# Patient Record
Sex: Male | Born: 1970 | Race: Black or African American | Hispanic: No | Marital: Single | Smoking: Current some day smoker
Health system: Southern US, Community
[De-identification: ages and names within clinical notes are randomized; demographics above are authoritative.]

## PROBLEM LIST (undated history)

## (undated) DIAGNOSIS — R55 Syncope and collapse: Secondary | ICD-10-CM

## (undated) DIAGNOSIS — R569 Unspecified convulsions: Secondary | ICD-10-CM

---

## 2015-05-27 ENCOUNTER — Other Ambulatory Visit: Payer: Self-pay

## 2015-05-27 ENCOUNTER — Emergency Department (HOSPITAL_COMMUNITY): Payer: Self-pay

## 2015-05-27 ENCOUNTER — Emergency Department (HOSPITAL_COMMUNITY)
Admission: EM | Admit: 2015-05-27 | Discharge: 2015-05-27 | Disposition: A | Discharge status: Home / Self Care | Payer: Self-pay | Attending: Emergency Medicine | Admitting: Emergency Medicine

## 2015-05-27 ENCOUNTER — Encounter (HOSPITAL_COMMUNITY): Payer: Self-pay | Admitting: Emergency Medicine

## 2015-05-27 DIAGNOSIS — Y990 Civilian activity done for income or pay: Secondary | ICD-10-CM | POA: Insufficient documentation

## 2015-05-27 DIAGNOSIS — S299XXA Unspecified injury of thorax, initial encounter: Secondary | ICD-10-CM | POA: Insufficient documentation

## 2015-05-27 DIAGNOSIS — R55 Syncope and collapse: Secondary | ICD-10-CM | POA: Insufficient documentation

## 2015-05-27 DIAGNOSIS — W19XXXA Unspecified fall, initial encounter: Secondary | ICD-10-CM

## 2015-05-27 DIAGNOSIS — Y9389 Activity, other specified: Secondary | ICD-10-CM | POA: Insufficient documentation

## 2015-05-27 DIAGNOSIS — Z720 Tobacco use: Secondary | ICD-10-CM | POA: Insufficient documentation

## 2015-05-27 DIAGNOSIS — Y9289 Other specified places as the place of occurrence of the external cause: Secondary | ICD-10-CM | POA: Insufficient documentation

## 2015-05-27 DIAGNOSIS — Z79899 Other long term (current) drug therapy: Secondary | ICD-10-CM | POA: Insufficient documentation

## 2015-05-27 DIAGNOSIS — S0990XA Unspecified injury of head, initial encounter: Secondary | ICD-10-CM | POA: Insufficient documentation

## 2015-05-27 DIAGNOSIS — W1839XA Other fall on same level, initial encounter: Secondary | ICD-10-CM | POA: Insufficient documentation

## 2015-05-27 DIAGNOSIS — R0789 Other chest pain: Secondary | ICD-10-CM

## 2015-05-27 DIAGNOSIS — S93401A Sprain of unspecified ligament of right ankle, initial encounter: Secondary | ICD-10-CM | POA: Insufficient documentation

## 2015-05-27 LAB — CBC
HCT: 38.9 % — ABNORMAL LOW (ref 39.0–52.0)
HEMOGLOBIN: 13.7 g/dL (ref 13.0–17.0)
MCH: 31.3 pg (ref 26.0–34.0)
MCHC: 35.2 g/dL (ref 30.0–36.0)
MCV: 88.8 fL (ref 78.0–100.0)
PLATELETS: 237 10*3/uL (ref 150–400)
RBC: 4.38 MIL/uL (ref 4.22–5.81)
RDW: 13.6 % (ref 11.5–15.5)
WBC: 7.5 10*3/uL (ref 4.0–10.5)

## 2015-05-27 LAB — BASIC METABOLIC PANEL
Anion gap: 8 (ref 5–15)
BUN: 25 mg/dL — AB (ref 6–20)
CALCIUM: 8.7 mg/dL — AB (ref 8.9–10.3)
CO2: 23 mmol/L (ref 22–32)
Chloride: 107 mmol/L (ref 101–111)
Creatinine, Ser: 1.1 mg/dL (ref 0.61–1.24)
GFR calc Af Amer: >60 mL/min (ref >60)
GLUCOSE: 124 mg/dL — AB (ref 65–99)
Potassium: 3.3 mmol/L — ABNORMAL LOW (ref 3.5–5.1)
Sodium: 138 mmol/L (ref 135–145)

## 2015-05-27 LAB — I-STAT TROPONIN, ED: TROPONIN I, POC: 0 ng/mL (ref 0.00–0.08)

## 2015-05-27 MED ORDER — IBUPROFEN 600 MG PO TABS: 600.0000 mg | ORAL_TABLET | Freq: Four times a day (QID) | ORAL | Status: DC | PRN

## 2015-05-27 MED ORDER — HYDROCODONE-ACETAMINOPHEN 5-325 MG PO TABS: 1.0000 | ORAL_TABLET | Freq: Four times a day (QID) | ORAL | Status: DC | PRN

## 2015-05-27 NOTE — ED Notes (Signed)
Patient visualized eating in the lobby, patient was previously told while in triage nothing to eat or drink til seen by provider.

## 2015-05-27 NOTE — ED Notes (Signed)
Pt c/o headache, chest pain and rt ankle pain. States he fell at work on Wednesday and hit his head. He admits that he lost consciousness and on lookers stated he looked to be having a seizure. He states he went home after incident

## 2015-05-27 NOTE — ED Provider Notes (Signed)
CSN: [id number]     Arrival date & time 05/27/15  0256 History   First MD Initiated Contact with Patient 05/27/15 [phone]     Chief Complaint  Patient presents with  . Chest Pain  . Ankle Pain    right     (Consider location/radiation/quality/duration/timing/severity/associated sxs/prior Treatment) HPI  Patient presents with multiple complaints. Patient reports that he fell at work on Wednesday. He hurt his right ankle. He is noted swelling and pain. Pain is worse with walking. Currently it is 8 out of 10. Has not done anything for the pain. He also reports chest pain. It is stabbing in nature. This started last night. Denies shortness of breath.  Denies cough or fever. Reports that after the fall he had a questionable episode of loss of consciousness. He was not evaluated. He has been ambulatory. Denies any history of hypertension, hyperlipidemia, diabetes. He does report an early family history of heart disease. He is a current smoker.  History reviewed. No pertinent past medical history. History reviewed. No pertinent past surgical history. History reviewed. No pertinent family history. Social History  Substance Use Topics  . Smoking status: Current Every Day Smoker -- 0.50 packs/day    Types: Cigarettes  . Smokeless tobacco: None  . Alcohol Use: No    Review of Systems  Constitutional: Negative.  Negative for fever.  Respiratory: Positive for chest tightness. Negative for shortness of breath.   Cardiovascular: Negative.  Negative for chest pain and leg swelling.  Gastrointestinal: Negative.  Negative for abdominal pain.  Genitourinary: Negative.  Negative for dysuria.  Musculoskeletal: Negative for back pain.       Ankle pain  Skin: Negative for rash.  Neurological: Positive for syncope and headaches. Negative for weakness, light-headedness and numbness.  All other systems reviewed and are negative.     Allergies  Review of patient's allergies indicates no known  allergies.  Home Medications   Prior to Admission medications   Medication Sig Start Date End Date Taking? Authorizing Provider  buPROPion (WELLBUTRIN SR) 200 MG 12 hr tablet Take 200 mg by mouth 2 (two) times daily.   Yes Historical Provider, MD  risperiDONE (RISPERDAL) 1 MG tablet Take 1 mg by mouth 2 (two) times daily.   Yes Historical Provider, MD  traMADol (ULTRAM) 50 MG tablet Take 50 mg by mouth every 6 (six) hours as needed (back pain).   Yes Historical Provider, MD  HYDROcodone-acetaminophen (NORCO/VICODIN) 5-325 MG tablet Take 1 tablet by mouth every 6 (six) hours as needed. 05/27/15   Shon Baton, MD  ibuprofen (ADVIL,MOTRIN) 600 MG tablet Take 1 tablet (600 mg total) by mouth every 6 (six) hours as needed. 05/27/15   Shon Baton, MD   BP 133/92 mmHg  Pulse 88  Temp(Src) 98.7 F (37.1 C) (Oral)  Resp 20  SpO2 100% Physical Exam  Constitutional: He is oriented to person, place, and time. He appears well-developed and well-nourished. No distress.  HENT:  Head: Normocephalic and atraumatic.  Right Ear: External ear normal.  Left Ear: External ear normal.  Mouth/Throat: Oropharynx is clear and moist.  Eyes: EOM are normal. Pupils are equal, round, and reactive to light.  Disconjugate gaze; abduction of left eye  Neck: Normal range of motion. Neck supple.  Cardiovascular: Normal rate, regular rhythm and normal heart sounds.   No murmur heard. Pulmonary/Chest: Effort normal and breath sounds normal. No respiratory distress. He has no wheezes. He exhibits tenderness.  Abdominal: Soft. Bowel sounds are normal.  There is no tenderness. There is no rebound.  Musculoskeletal: He exhibits no edema.  Normal range of motion of the right ankle, mild swelling noted, 2+ DP pulse, no obvious deformities, tenderness to palpation over the lateral malleolus  Lymphadenopathy:    He has no cervical adenopathy.  Neurological: He is alert and oriented to person, place, and time.  5  out of 5 strength in all 4 extremities, no dysmetria to finger-nose-finger  Skin: Skin is warm and dry.  Psychiatric: He has a normal mood and affect.  Nursing note and vitals reviewed.   ED Course  Procedures (including critical care time) Labs Review Labs Reviewed  BASIC METABOLIC PANEL  CBC    Imaging Review No results found. I have personally reviewed and evaluated these images and lab results as part of my medical decision-making.   EKG Interpretation ED ECG REPORT   Date: 05/27/2015  Rate: 88  Rhythm: normal sinus rhythm  QRS Axis: normal  Intervals: normal  ST/T Wave abnormalities: normal  Conduction Disutrbances:none  Narrative Interpretation:   Old EKG Reviewed: none available  I have personally reviewed the EKG tracing and agree with the computerized printout as noted.       MDM   Final diagnoses:  Fall, initial encounter  Ankle sprain, right, initial encounter  Chest wall pain    Patient presents with multiple complaints. Nontoxic on exam. Vital signs are reassuring. Patient reports recent fall with associated right ankle pain. Also has developed chest pain and reports possible syncopal episode. Nonfocal on exam. Pain is reproducible in the chest and EKG is reassuring. Chest x-ray is within normal limits. He is neurovascularly intact in the right ankle. X-ray of the right ankle negative. CT head obtained given report of syncope, headache, and fall. CT scan is negative. Basic labwork is also reassuring including troponin. Suspect musculoskeletal pain secondary to fall. Also ankle sprain. Will discharge home with a short course of pain medication.  After history, exam, and medical workup I feel the patient has been appropriately medically screened and is safe for discharge home. Pertinent diagnoses were discussed with the patient. Patient was given return precautions.     Shon Batonourtney F Horton, MD 05/27/15 [phone]

## 2015-05-27 NOTE — Discharge Instructions (Signed)
Ankle Sprain °An ankle sprain is an injury to the strong, fibrous tissues (ligaments) that hold the bones of your ankle joint together.  °CAUSES °An ankle sprain is usually caused by a fall or by twisting your ankle. Ankle sprains most commonly occur when you step on the outer edge of your foot, and your ankle turns inward. People who participate in sports are more prone to these types of injuries.  °SYMPTOMS  °· Pain in your ankle. The pain may be present at rest or only when you are trying to stand or walk. °· Swelling. °· Bruising. Bruising may develop immediately or within 1 to 2 days after your injury. °· Difficulty standing or walking, particularly when turning corners or changing directions. °DIAGNOSIS  °Your caregiver will ask you details about your injury and perform a physical exam of your ankle to determine if you have an ankle sprain. During the physical exam, your caregiver will press on and apply pressure to specific areas of your foot and ankle. Your caregiver will try to move your ankle in certain ways. An X-ray exam may be done to be sure a bone was not broken or a ligament did not separate from one of the bones in your ankle (avulsion fracture).  °TREATMENT  °Certain types of braces can help stabilize your ankle. Your caregiver can make a recommendation for this. Your caregiver may recommend the use of medicine for pain. If your sprain is severe, your caregiver may refer you to a surgeon who helps to restore function to parts of your skeletal system (orthopedist) or a physical therapist. °HOME CARE INSTRUCTIONS  °· Apply ice to your injury for 1-2 days or as directed by your caregiver. Applying ice helps to reduce inflammation and pain. °· Put ice in a plastic bag. °· Place a towel between your skin and the bag. °· Leave the ice on for 15-20 minutes at a time, every 2 hours while you are awake. °· Only take over-the-counter or prescription medicines for pain, discomfort, or fever as directed by  your caregiver. °· Elevate your injured ankle above the level of your heart as much as possible for 2-3 days. °· If your caregiver recommends crutches, use them as instructed. Gradually put weight on the affected ankle. Continue to use crutches or a cane until you can walk without feeling pain in your ankle. °· If you have a plaster splint, wear the splint as directed by your caregiver. Do not rest it on anything harder than a pillow for the first 24 hours. Do not put weight on it. Do not get it wet. You may take it off to take a shower or bath. °· You may have been given an elastic bandage to wear around your ankle to provide support. If the elastic bandage is too tight (you have numbness or tingling in your foot or your foot becomes cold and blue), adjust the bandage to make it comfortable. °· If you have an air splint, you may blow more air into it or let air out to make it more comfortable. You may take your splint off at night and before taking a shower or bath. Wiggle your toes in the splint several times per day to decrease swelling. °SEEK MEDICAL CARE IF:  °· You have rapidly increasing bruising or swelling. °· Your toes feel extremely cold or you lose feeling in your foot. °· Your pain is not relieved with medicine. °SEEK IMMEDIATE MEDICAL CARE IF: °· Your toes are numb or blue. °·   You have severe pain that is increasing. MAKE SURE YOU:   Understand these instructions.  Will watch your condition.  Will get help right away if you are not doing well or get worse.   This information is not intended to replace advice given to you by your health care provider. Make sure you discuss any questions you have with your health care provider.   Document Released: 07/20/2005 Document Revised: 08/10/2014 Document Reviewed: 08/01/2011 Elsevier Interactive Patient Education 2016 Elsevier Inc. Chest Wall Pain Chest wall pain is pain in or around the bones and muscles of your chest. Sometimes, an injury causes  this pain. Sometimes, the cause may not be known. This pain may take several weeks or longer to get better. HOME CARE INSTRUCTIONS  Pay attention to any changes in your symptoms. Take these actions to help with your pain:   Rest as told by your health care provider.   Avoid activities that cause pain. These include any activities that use your chest muscles or your abdominal and side muscles to lift heavy items.   If directed, apply ice to the painful area:  Put ice in a plastic bag.  Place a towel between your skin and the bag.  Leave the ice on for 20 minutes, 2-3 times per day.  Take over-the-counter and prescription medicines only as told by your health care provider.  Do not use tobacco products, including cigarettes, chewing tobacco, and e-cigarettes. If you need help quitting, ask your health care provider.  Keep all follow-up visits as told by your health care provider. This is important. SEEK MEDICAL CARE IF:  You have a fever.  Your chest pain becomes worse.  You have new symptoms. SEEK IMMEDIATE MEDICAL CARE IF:  You have nausea or vomiting.  You feel sweaty or light-headed.  You have a cough with phlegm (sputum) or you cough up blood.  You develop shortness of breath.   This information is not intended to replace advice given to you by your health care provider. Make sure you discuss any questions you have with your health care provider.   Document Released: 07/20/2005 Document Revised: 04/10/2015 Document Reviewed: 10/15/2014 Elsevier Interactive Patient Education [employer] Else[employer] Inc[employer]

## 2015-05-27 NOTE — ED Notes (Signed)
Patient c/o central chest pain that started last night. Patient also c/o right ankle pain x2-3 days ago.

## 2015-06-02 ENCOUNTER — Encounter (HOSPITAL_COMMUNITY): Payer: Self-pay | Admitting: Emergency Medicine

## 2015-06-02 ENCOUNTER — Emergency Department (HOSPITAL_COMMUNITY)
Admission: EM | Admit: 2015-06-02 | Discharge: 2015-06-02 | Discharge status: Against Medical Advice | Payer: Self-pay | Attending: Emergency Medicine | Admitting: Emergency Medicine

## 2015-06-02 DIAGNOSIS — Y9389 Activity, other specified: Secondary | ICD-10-CM | POA: Insufficient documentation

## 2015-06-02 DIAGNOSIS — X58XXXA Exposure to other specified factors, initial encounter: Secondary | ICD-10-CM | POA: Insufficient documentation

## 2015-06-02 DIAGNOSIS — S0990XA Unspecified injury of head, initial encounter: Secondary | ICD-10-CM | POA: Insufficient documentation

## 2015-06-02 DIAGNOSIS — Y990 Civilian activity done for income or pay: Secondary | ICD-10-CM | POA: Insufficient documentation

## 2015-06-02 DIAGNOSIS — Y9289 Other specified places as the place of occurrence of the external cause: Secondary | ICD-10-CM | POA: Insufficient documentation

## 2015-06-02 DIAGNOSIS — S99919A Unspecified injury of unspecified ankle, initial encounter: Secondary | ICD-10-CM | POA: Insufficient documentation

## 2015-06-02 DIAGNOSIS — F17210 Nicotine dependence, cigarettes, uncomplicated: Secondary | ICD-10-CM | POA: Insufficient documentation

## 2015-06-02 NOTE — ED Notes (Signed)
Pt presents to ED to be seen for head inj and ankle inj he stated happened @ 0215 while working for [employer]. Pt found to be locked in bathroom in the basement by security then followed to 4th floor and did the same. Pt holding hard had with [employer] insignia. Pt became very agitated at being questioned regarding employment/injuries/timeline. Pt here on 24th for very similar complaint, stating he worked for [employer]. Pt then walking with steady gait came from triage room stating he was leaving, pt then became involved in aggressive verbal conversation with security and [employer]. Pt then left the property.

## 2015-06-25 ENCOUNTER — Encounter (HOSPITAL_COMMUNITY): Payer: Self-pay | Admitting: Emergency Medicine

## 2015-06-25 DIAGNOSIS — R000 Tachycardia, unspecified: Secondary | ICD-10-CM | POA: Insufficient documentation

## 2015-06-25 DIAGNOSIS — Z79899 Other long term (current) drug therapy: Secondary | ICD-10-CM | POA: Insufficient documentation

## 2015-06-25 DIAGNOSIS — R42 Dizziness and giddiness: Secondary | ICD-10-CM | POA: Insufficient documentation

## 2015-06-25 DIAGNOSIS — R112 Nausea with vomiting, unspecified: Secondary | ICD-10-CM | POA: Insufficient documentation

## 2015-06-25 DIAGNOSIS — M791 Myalgia: Secondary | ICD-10-CM | POA: Insufficient documentation

## 2015-06-25 DIAGNOSIS — F17210 Nicotine dependence, cigarettes, uncomplicated: Secondary | ICD-10-CM | POA: Insufficient documentation

## 2015-06-25 DIAGNOSIS — R61 Generalized hyperhidrosis: Secondary | ICD-10-CM | POA: Insufficient documentation

## 2015-06-25 DIAGNOSIS — R55 Syncope and collapse: Secondary | ICD-10-CM | POA: Insufficient documentation

## 2015-06-25 NOTE — ED Notes (Signed)
The patient said they brought him from work because his co-workers said he passed out for about 30 seconds.  The patient said that his co-workers told him when he came to he was talking crazy.  The patient is complaining of neck pain rates it 7/10.

## 2015-06-26 ENCOUNTER — Emergency Department (HOSPITAL_COMMUNITY)
Admission: EM | Admit: 2015-06-26 | Discharge: 2015-06-26 | Disposition: A | Discharge status: Home / Self Care | Payer: Self-pay | Attending: Emergency Medicine | Admitting: Emergency Medicine

## 2015-06-26 DIAGNOSIS — R55 Syncope and collapse: Secondary | ICD-10-CM

## 2015-06-26 LAB — BASIC METABOLIC PANEL
Anion gap: 6 (ref 5–15)
BUN: 19 mg/dL (ref 6–20)
CHLORIDE: 105 mmol/L (ref 101–111)
CO2: 27 mmol/L (ref 22–32)
CREATININE: 1.29 mg/dL — AB (ref 0.61–1.24)
Calcium: 9.1 mg/dL (ref 8.9–10.3)
Glucose, Bld: 102 mg/dL — ABNORMAL HIGH (ref 65–99)
Potassium: 4.1 mmol/L (ref 3.5–5.1)
SODIUM: 138 mmol/L (ref 135–145)

## 2015-06-26 LAB — CBC
HEMATOCRIT: 40.8 % (ref 39.0–52.0)
Hemoglobin: 14.2 g/dL (ref 13.0–17.0)
MCH: 31.5 pg (ref 26.0–34.0)
MCHC: 34.8 g/dL (ref 30.0–36.0)
MCV: 90.5 fL (ref 78.0–100.0)
PLATELETS: 234 10*3/uL (ref 150–400)
RBC: 4.51 MIL/uL (ref 4.22–5.81)
RDW: 13.6 % (ref 11.5–15.5)
WBC: 8.5 10*3/uL (ref 4.0–10.5)

## 2015-06-26 LAB — URINALYSIS, ROUTINE W REFLEX MICROSCOPIC
Bilirubin Urine: NEGATIVE
GLUCOSE, UA: NEGATIVE mg/dL
Hgb urine dipstick: NEGATIVE
Ketones, ur: NEGATIVE mg/dL
LEUKOCYTES UA: NEGATIVE
NITRITE: NEGATIVE
Protein, ur: NEGATIVE mg/dL
Specific Gravity, Urine: 1.023 (ref 1.005–1.030)
pH: 6 (ref 5.0–8.0)

## 2015-06-26 MED ORDER — ETODOLAC 400 MG PO TABS: 400.0000 mg | ORAL_TABLET | Freq: Two times a day (BID) | ORAL | Status: DC | PRN

## 2015-06-26 NOTE — ED Notes (Signed)
Pt verbalized understanding of d/c instructions, prescriptions, and follow-up care. No further questions/concerns, VSS, ambulatory w/ steady gait (refused wheelchair) 

## 2015-06-26 NOTE — ED Provider Notes (Signed)
CSN: [id number]     Arrival date & time 06/25/15  2346 History  By signing my name below, I, Emmanuella Mensah, attest that this documentation has been prepared under the direction and in the presence of Gilda Crease, MD. Electronically Signed: Angelene Giovanni, ED Scribe. 06/26/2015. 1:28 AM.     Chief Complaint  Patient presents with  . Loss of Consciousness    The patient said they brought him from work because his co-workers said he passed out for about 30 seconds.  The patient said that his co-workers told him when he came to he was talking crazy.      The history is provided by the patient. No language interpreter was used.   HPI Comments: Joseph Anthony is a 44 y.o. male who presents to the Emergency Department complaining of gradually worsening intermittent episodes of dizziness and loss of consciousness onset 2 weeks ago. He reports associated mild tachycardia and vomiting in the waiting room today. He denies any abdominal pain or numbness/weakness. He states that his latest episode was approx 2 hours ago. He explains his episodes as having a generalized sharp pain throughout his body which leads to diaphoresis and dizziness. He states that he has had two episodes at work and he is in jeopardy of loosing his job. He adds that his co-workers have informed him that sometimes he walks off balance while at work.    History reviewed. No pertinent past medical history. History reviewed. No pertinent past surgical history. History reviewed. No pertinent family history. Social History  Substance Use Topics  . Smoking status: Current Every Day Smoker -- 0.50 packs/day    Types: Cigarettes  . Smokeless tobacco: None  . Alcohol Use: No    Review of Systems  Constitutional: Negative for fever.  Gastrointestinal: Positive for nausea and vomiting. Negative for abdominal pain and diarrhea.  Musculoskeletal: Negative for arthralgias.  Neurological: Positive for dizziness. Negative  for weakness and numbness.  All other systems reviewed and are negative.     Allergies  Review of patient's allergies indicates no known allergies.  Home Medications   Prior to Admission medications   Medication Sig Start Date End Date Taking? Authorizing Provider  buPROPion (WELLBUTRIN SR) 200 MG 12 hr tablet Take 200 mg by mouth 2 (two) times daily.    Historical Provider, MD  HYDROcodone-acetaminophen (NORCO/VICODIN) 5-325 MG tablet Take 1 tablet by mouth every 6 (six) hours as needed. 05/27/15   Shon Baton, MD  ibuprofen (ADVIL,MOTRIN) 600 MG tablet Take 1 tablet (600 mg total) by mouth every 6 (six) hours as needed. 05/27/15   Shon Baton, MD  risperiDONE (RISPERDAL) 1 MG tablet Take 1 mg by mouth 2 (two) times daily.    Historical Provider, MD  traMADol (ULTRAM) 50 MG tablet Take 50 mg by mouth every 6 (six) hours as needed (back pain).    Historical Provider, MD   Pulse 89  Temp(Src) 98.4 F (36.9 C) (Oral)  Resp 16  Ht  (1.702 m)  Wt 249 lb 11.2 oz (113.263 kg)  BMI 39.10 kg/m2  SpO2 97% Physical Exam  Constitutional: He is oriented to person, place, and time. He appears well-developed and well-nourished. No distress.  HENT:  Head: Normocephalic and atraumatic.  Right Ear: Hearing normal.  Left Ear: Hearing normal.  Nose: Nose normal.  Mouth/Throat: Oropharynx is clear and moist and mucous membranes are normal.  Eyes: Conjunctivae and EOM are normal. Pupils are equal, round, and reactive to light.  Neck: Normal range of motion. Neck supple. Muscular tenderness present. No spinous process tenderness present.  Cardiovascular: Regular rhythm, S1 normal and S2 normal.  Exam reveals no gallop and no friction rub.   No murmur heard. Pulmonary/Chest: Effort normal and breath sounds normal. No respiratory distress. He exhibits no tenderness.  Abdominal: Soft. Normal appearance and bowel sounds are normal. There is no hepatosplenomegaly. There is no  tenderness. There is no rebound, no guarding, no tenderness at McBurney's point and negative Murphy's sign. No hernia.  Musculoskeletal: Normal range of motion.  Neurological: He is alert and oriented to person, place, and time. He has normal strength. No cranial nerve deficit or sensory deficit. Coordination normal. GCS eye subscore is 4. GCS verbal subscore is 5. GCS motor subscore is 6.  Extraocular muscle movement: normal No visual field cut Pupils: equal and reactive both direct and consensual response is normal No nystagmus present    Sensory function is intact to light touch, pinprick Proprioception intact  Grip strength 5/5 symmetric in upper extremities No pronator drift Normal finger to nose bilaterally  Lower extremity strength 5/5 against gravity Normal heel to shin bilaterally  Gait: normal   Skin: Skin is warm, dry and intact. No rash noted. No cyanosis.  Psychiatric: He has a normal mood and affect. His speech is normal and behavior is normal. Thought content normal.  Nursing note and vitals reviewed.   ED Course  Procedures (including critical care time) DIAGNOSTIC STUDIES: Oxygen Saturation is 97% on RA, adequate by my interpretation.    COORDINATION OF CARE: 1:25 AM- Pt advised of plan for treatment and pt agrees. Will refer to Neurologist after reviewing labs and past visits.    Labs Review Labs Reviewed  BASIC METABOLIC PANEL - Abnormal; Notable for the following:    Glucose, Bld 102 (*)    Creatinine, Ser 1.29 (*)    All other components within normal limits  CBC  URINALYSIS, ROUTINE W REFLEX MICROSCOPIC (NOT AT [facility])  CBG MONITORING, ED    Imaging Review No results found.   Gilda Crease, MD has personally reviewed and evaluated these images and lab results as part of his medical decision-making.   EKG Interpretation   Date/Time:  Wednesday June 26 2015 00:02:19 EST Ventricular Rate:  89 PR Interval:  158 QRS Duration: 86 QT  Interval:  344 QTC Calculation: 418 R Axis:   26 Text Interpretation:  Normal sinus rhythm Cannot rule out Anterior infarct  , age undetermined Abnormal ECG No significant change since last tracing  Confirmed by POLLINA  MD, CHRISTOPHER [phone]) on 06/26/2015 1:22:51 AM      MDM   Final diagnoses:  None   syncope  Presents to the emergency Department with multiple complaints. Patient reports that he passed out at work. Patient has had several episodes of passing out recently. He reportedly had a fall at work caused by passing out recently, was seen at the [employer]. Reviewing this encounter reveals that he did have a head CT and cardiac evaluation at that time, both of which were negative. Patient reports that he has episodes where he suddenly becomes weak, then dizzy, then he passes out. He bystanders at work today reportedly stated that after he passed out he awakened, was confused and "talking crazy" for a period of time. By arrival to the ER, patient back to normal baseline has no neurologic deficit. He does not require repeat CT of his head. He is complaining of neck pain, examination  reveals bilateral paraspinal soft tissue tenderness without midline tenderness. Cervical spine clinically cleared.  Records have been reviewed and the patient has been seen multiple times in the ER with similar complaints. He has also exhibited very erratic behavior during these visits. Patient is exhibiting somewhat odd affect here in the ER, but is not homicidal or suicidal. He is appropriate for discharge at this time. Will refer to neurology for further evaluation of syncope. As he did have a period of confusion after his syncope, seizure is considered, although felt to be unlikely.  I personally performed the services described in this documentation, which was scribed in my presence. The recorded information has been reviewed and is accurate.   Gilda Creasehristopher J Pollina, MD 06/26/15 [phone]

## 2015-06-26 NOTE — Discharge Instructions (Signed)

## 2015-06-28 ENCOUNTER — Observation Stay (HOSPITAL_BASED_OUTPATIENT_CLINIC_OR_DEPARTMENT_OTHER): Payer: Self-pay

## 2015-06-28 ENCOUNTER — Observation Stay (HOSPITAL_COMMUNITY)
Admission: EM | Admit: 2015-06-28 | Discharge: 2015-06-30 | Disposition: A | Discharge status: Home / Self Care | Payer: Self-pay | Attending: Internal Medicine | Admitting: Internal Medicine

## 2015-06-28 ENCOUNTER — Emergency Department (HOSPITAL_COMMUNITY): Payer: Self-pay

## 2015-06-28 ENCOUNTER — Observation Stay (HOSPITAL_COMMUNITY): Payer: Self-pay

## 2015-06-28 ENCOUNTER — Encounter (HOSPITAL_COMMUNITY): Payer: Self-pay | Admitting: Emergency Medicine

## 2015-06-28 DIAGNOSIS — R55 Syncope and collapse: Principal | ICD-10-CM | POA: Diagnosis present

## 2015-06-28 DIAGNOSIS — Z8249 Family history of ischemic heart disease and other diseases of the circulatory system: Secondary | ICD-10-CM

## 2015-06-28 DIAGNOSIS — R079 Chest pain, unspecified: Secondary | ICD-10-CM

## 2015-06-28 DIAGNOSIS — R42 Dizziness and giddiness: Secondary | ICD-10-CM | POA: Insufficient documentation

## 2015-06-28 DIAGNOSIS — F17210 Nicotine dependence, cigarettes, uncomplicated: Secondary | ICD-10-CM

## 2015-06-28 DIAGNOSIS — R5383 Other fatigue: Secondary | ICD-10-CM | POA: Insufficient documentation

## 2015-06-28 DIAGNOSIS — Z8669 Personal history of other diseases of the nervous system and sense organs: Secondary | ICD-10-CM

## 2015-06-28 DIAGNOSIS — R1110 Vomiting, unspecified: Secondary | ICD-10-CM | POA: Insufficient documentation

## 2015-06-28 HISTORY — DX: Unspecified convulsions: R56.9

## 2015-06-28 LAB — CBC WITH DIFFERENTIAL/PLATELET
Basophils Absolute: 0 10*3/uL (ref 0.0–0.1)
Basophils Relative: 0 %
EOS ABS: 0.1 10*3/uL (ref 0.0–0.7)
EOS PCT: 2 %
HCT: 38.7 % — ABNORMAL LOW (ref 39.0–52.0)
Hemoglobin: 13.2 g/dL (ref 13.0–17.0)
LYMPHS PCT: 30 %
Lymphs Abs: 1.7 10*3/uL (ref 0.7–4.0)
MCH: 31.1 pg (ref 26.0–34.0)
MCHC: 34.1 g/dL (ref 30.0–36.0)
MCV: 91.3 fL (ref 78.0–100.0)
MONO ABS: 0.2 10*3/uL (ref 0.1–1.0)
Monocytes Relative: 4 %
NEUTROS ABS: 3.6 10*3/uL (ref 1.7–7.7)
Neutrophils Relative %: 64 %
PLATELETS: 217 10*3/uL (ref 150–400)
RBC: 4.24 MIL/uL (ref 4.22–5.81)
RDW: 13.8 % (ref 11.5–15.5)
WBC: 5.6 10*3/uL (ref 4.0–10.5)

## 2015-06-28 LAB — COMPREHENSIVE METABOLIC PANEL
ALK PHOS: 83 U/L (ref 38–126)
ALT: 16 U/L — ABNORMAL LOW (ref 17–63)
ANION GAP: 7 (ref 5–15)
AST: 28 U/L (ref 15–41)
Albumin: 3.4 g/dL — ABNORMAL LOW (ref 3.5–5.0)
BILIRUBIN TOTAL: 0.7 mg/dL (ref 0.3–1.2)
BUN: 17 mg/dL (ref 6–20)
CALCIUM: 8.6 mg/dL — AB (ref 8.9–10.3)
CO2: 24 mmol/L (ref 22–32)
Chloride: 107 mmol/L (ref 101–111)
Creatinine, Ser: 1.24 mg/dL (ref 0.61–1.24)
Glucose, Bld: 118 mg/dL — ABNORMAL HIGH (ref 65–99)
POTASSIUM: 3.4 mmol/L — AB (ref 3.5–5.1)
Sodium: 138 mmol/L (ref 135–145)
TOTAL PROTEIN: 6.4 g/dL — AB (ref 6.5–8.1)

## 2015-06-28 LAB — TROPONIN I: Troponin I: <0.03 ng/mL (ref <0.031)

## 2015-06-28 LAB — TSH: TSH: 0.772 u[IU]/mL (ref 0.350–4.500)

## 2015-06-28 LAB — CBC
HCT: 37.2 % — ABNORMAL LOW (ref 39.0–52.0)
Hemoglobin: 13 g/dL (ref 13.0–17.0)
MCH: 31.8 pg (ref 26.0–34.0)
MCHC: 34.9 g/dL (ref 30.0–36.0)
MCV: 91 fL (ref 78.0–100.0)
PLATELETS: 211 10*3/uL (ref 150–400)
RBC: 4.09 MIL/uL — AB (ref 4.22–5.81)
RDW: 13.8 % (ref 11.5–15.5)
WBC: 5.4 10*3/uL (ref 4.0–10.5)

## 2015-06-28 LAB — LIPID PANEL
Cholesterol: 145 mg/dL (ref 0–200)
HDL: 42 mg/dL (ref >40)
LDL Cholesterol: 92 mg/dL (ref 0–99)
TRIGLYCERIDES: 57 mg/dL (ref <150)
Total CHOL/HDL Ratio: 3.5 RATIO
VLDL: 11 mg/dL (ref 0–40)

## 2015-06-28 LAB — RAPID URINE DRUG SCREEN, HOSP PERFORMED
Amphetamines: NOT DETECTED
Barbiturates: NOT DETECTED
Benzodiazepines: NOT DETECTED
Cocaine: NOT DETECTED
OPIATES: NOT DETECTED
Tetrahydrocannabinol: NOT DETECTED

## 2015-06-28 LAB — I-STAT TROPONIN, ED: TROPONIN I, POC: 0 ng/mL (ref 0.00–0.08)

## 2015-06-28 LAB — ETHANOL: Alcohol, Ethyl (B): <5 mg/dL (ref <5)

## 2015-06-28 LAB — MAGNESIUM: MAGNESIUM: 1.7 mg/dL (ref 1.7–2.4)

## 2015-06-28 MED ORDER — RISPERIDONE 0.5 MG PO TABS: 1.0000 mg | ORAL_TABLET | Freq: Two times a day (BID) | ORAL | Status: DC

## 2015-06-28 MED ORDER — IOHEXOL 350 MG/ML SOLN
80.0000 mL | Freq: Once | INTRAVENOUS | Status: AC | PRN
  Administered 2015-06-28: 80 mL via INTRAVENOUS

## 2015-06-28 MED ORDER — ENOXAPARIN SODIUM 40 MG/0.4ML ~~LOC~~ SOLN
40.0000 mg | SUBCUTANEOUS | Status: DC
  Administered 2015-06-28 – 2015-06-29 (×2): 40 mg via SUBCUTANEOUS
  Filled 2015-06-28 (×2): qty 0.4

## 2015-06-28 MED ORDER — PROMETHAZINE HCL 25 MG PO TABS
12.5000 mg | ORAL_TABLET | Freq: Four times a day (QID) | ORAL | Status: DC | PRN
  Filled 2015-06-28 (×2): qty 1

## 2015-06-28 MED ORDER — SODIUM CHLORIDE 0.9 % IV BOLUS (SEPSIS)
1000.0000 mL | Freq: Once | INTRAVENOUS | Status: AC
  Administered 2015-06-28: 1000 mL via INTRAVENOUS

## 2015-06-28 MED ORDER — POTASSIUM CHLORIDE CRYS ER 20 MEQ PO TBCR
40.0000 meq | EXTENDED_RELEASE_TABLET | Freq: Once | ORAL | Status: AC
  Administered 2015-06-28: 40 meq via ORAL
  Filled 2015-06-28: qty 2

## 2015-06-28 MED ORDER — SODIUM CHLORIDE 0.9 % IJ SOLN
3.0000 mL | Freq: Two times a day (BID) | INTRAMUSCULAR | Status: DC
  Administered 2015-06-28 – 2015-06-29 (×2): 3 mL via INTRAVENOUS

## 2015-06-28 MED ORDER — DIPHENHYDRAMINE HCL 50 MG/ML IJ SOLN
25.0000 mg | Freq: Once | INTRAMUSCULAR | Status: AC
  Administered 2015-06-28: 25 mg via INTRAVENOUS
  Filled 2015-06-28: qty 1

## 2015-06-28 MED ORDER — ACETAMINOPHEN 500 MG PO TABS
500.0000 mg | ORAL_TABLET | Freq: Four times a day (QID) | ORAL | Status: DC | PRN
  Administered 2015-06-28: 500 mg via ORAL
  Filled 2015-06-28: qty 1

## 2015-06-28 MED ORDER — MAGNESIUM SULFATE 2 GM/50ML IV SOLN
2.0000 g | Freq: Once | INTRAVENOUS | Status: AC
  Administered 2015-06-28: 2 g via INTRAVENOUS
  Filled 2015-06-28: qty 50

## 2015-06-28 MED ORDER — PROCHLORPERAZINE EDISYLATE 5 MG/ML IJ SOLN
10.0000 mg | Freq: Four times a day (QID) | INTRAMUSCULAR | Status: DC | PRN
  Administered 2015-06-28: 10 mg via INTRAVENOUS
  Filled 2015-06-28: qty 2

## 2015-06-28 NOTE — ED Notes (Signed)
Pt states that he has had 2 other syncopal episodes and has an appointment with a neurologist on Monday.

## 2015-06-28 NOTE — ED Provider Notes (Signed)
CSN: [id number]     Arrival date & time 06/28/15  0645 History   First MD Initiated Contact with Patient 06/28/15 [id number]     Chief Complaint  Patient presents with  . Chest Pain  . Loss of Consciousness     (Consider location/radiation/quality/duration/timing/severity/associated sxs/prior Treatment) HPI Comments: Doing paperwork this AM, stood from sitting and passed out Felt lightheaded, full body weaknessn then had episode 45sec syncope No urinary/stool incontinence/no tongue biting no shaking Vomited on self Pt states whole body hurts, head, chest, abdomen CP middle of chest to lower abdomen, worse with movement, to lower back, worse with movement 7/10, sharp pain, nothing else makes it better or worse Not wearing hard hat Has had several episodes of syncope over last few weeks at work Ate cereal this AM Most recent was 11/22 and came to ED  No other medical problems other than depression +cigarettes No htn/hlp/DM/father died at 6032 of MI    Patient is a 44 y.o. male presenting with chest pain and syncope.  Chest Pain Associated symptoms: fatigue, syncope and vomiting   Associated symptoms: no abdominal pain, no back pain, no fever, no headache, no nausea, no numbness, no shortness of breath and no weakness   Loss of Consciousness Associated symptoms: chest pain and vomiting   Associated symptoms: no fever, no headaches, no nausea, no seizures, no shortness of breath and no weakness     History reviewed. No pertinent past medical history. History reviewed. No pertinent past surgical history. History reviewed. No pertinent family history. Social History  Substance Use Topics  . Smoking status: Current Some Day Smoker -- 0.50 packs/day    Types: Cigarettes  . Smokeless tobacco: None  . Alcohol Use: No    Review of Systems  Constitutional: Positive for fatigue. Negative for fever and appetite change.  HENT: Negative for sore throat.   Eyes: Negative for visual  disturbance.  Respiratory: Negative for shortness of breath.   Cardiovascular: Positive for chest pain and syncope.  Gastrointestinal: Positive for vomiting. Negative for nausea, abdominal pain, diarrhea, constipation and blood in stool.  Genitourinary: Negative for difficulty urinating.  Musculoskeletal: Negative for back pain and neck stiffness.  Skin: Negative for rash.  Neurological: Positive for syncope and light-headedness. Negative for seizures, facial asymmetry, speech difficulty, weakness, numbness and headaches.      Allergies  Review of patient's allergies indicates no known allergies.  Home Medications   Prior to Admission medications   Medication Sig Start Date End Date Taking? Authorizing Provider  buPROPion (WELLBUTRIN SR) 200 MG 12 hr tablet Take 200 mg by mouth 2 (two) times daily.   Yes Historical Provider, MD  risperiDONE (RISPERDAL) 1 MG tablet Take 1 mg by mouth 2 (two) times daily.   Yes Historical Provider, MD  traMADol (ULTRAM) 50 MG tablet Take 50 mg by mouth every 6 (six) hours as needed (back pain).   Yes Historical Provider, MD   BP 100/55 mmHg  Temp(Src) 98.3 F (36.8 C) (Oral)  Resp 15  Ht 5\' 7"  (1.702 m)  Wt 240 lb (108.863 kg)  BMI 37.58 kg/m2 Physical Exam  Constitutional: He is oriented to person, place, and time. He appears well-developed and well-nourished. No distress.  HENT:  Head: Normocephalic and atraumatic.  Eyes: Conjunctivae and EOM are normal.  Neck: Normal range of motion.  Cardiovascular: Normal rate, regular rhythm, normal heart sounds and intact distal pulses.  Exam reveals no gallop and no friction rub.   No murmur heard. Pulmonary/Chest:  Effort normal and breath sounds normal. No respiratory distress. He has no wheezes. He has no rales.  Abdominal: Soft. He exhibits no distension. There is no tenderness. There is no guarding.  Musculoskeletal: He exhibits no edema.  Neurological: He is alert and oriented to person, place, and  time. He has normal strength. No cranial nerve deficit or sensory deficit. Coordination and gait normal. GCS eye subscore is 4. GCS verbal subscore is 5. GCS motor subscore is 6.  Skin: Skin is warm and dry. He is not diaphoretic.  Nursing note and vitals reviewed.   ED Course  Procedures (including critical care time) Labs Review Labs Reviewed  COMPREHENSIVE METABOLIC PANEL - Abnormal; Notable for the following:    Potassium 3.4 (*)    Glucose, Bld 118 (*)    Calcium 8.6 (*)    Total Protein 6.4 (*)    Albumin 3.4 (*)    ALT 16 (*)    All other components within normal limits  CBC WITH DIFFERENTIAL/PLATELET - Abnormal; Notable for the following:    HCT 38.7 (*)    All other components within normal limits  URINE RAPID DRUG SCREEN, HOSP PERFORMED  ETHANOL  TROPONIN I  TROPONIN I  Rosezena Sensor, ED    Imaging Review Dg Chest 1 View  06/28/2015  CLINICAL DATA:  Syncope. EXAM: CHEST 1 VIEW COMPARISON:  05/27/2015. FINDINGS: Mediastinum and hilar structures normal. Borderline cardiomegaly. Borderline pulmonary venous congestion. Bilateral pulmonary interstitial prominence. A mild component congestive heart failure cannot be excluded. Pneumonitis cannot be excluded. Low lung volumes. No pleural effusion or pneumothorax. IMPRESSION: 1. Borderline cardiomegaly and pulmonary venous congestion with mild pulmonary interstitial prominence. Mild component congestive heart failure cannot be excluded. Mild pneumonitis cannot be excluded. 2. Low lung volumes. Electronically Signed   By: Maisie Fus  Register   On: 06/28/2015 07:54   Ct Angio Chest Pe W/cm &/or Wo Cm  06/28/2015  CLINICAL DATA:  Chest pain, shortness of breath and dizziness for the last 3 days. Syncopal episode this morning. EXAM: CT ANGIOGRAPHY CHEST WITH CONTRAST TECHNIQUE: Multidetector CT imaging of the chest was performed using the standard protocol during bolus administration of intravenous contrast. Multiplanar CT image  reconstructions and MIPs were obtained to evaluate the vascular anatomy. CONTRAST:  80mL OMNIPAQUE IOHEXOL 350 MG/ML SOLN COMPARISON:  Chest radiograph 06/28/2015 FINDINGS: There is no evidence of pulmonary embolus. There is a small hiatal hernia. There is no evidence of focal lung parenchymal consolidation, pleural effusion or pneumothorax. There is mild pulmonary edema. There is a 4 mm right middle lobe subpleural perifissural nodule pulmonary. There is a mildly prominent right peribronchial lymph node, nonspecific. The heart is normal in size. Great vessels are normal in caliber. No axillary lymphadenopathy is seen. The visualized portions of the thyroid gland are unremarkable in appearance. The visualized portions of the liver and spleen are unremarkable. The visualized portions of the pancreas, gallbladder, stomach, adrenal glands and kidneys are within normal limits. No acute osseous abnormalities are seen. 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 Review of the MIP images confirms the above findings. IMPRESSION: No evidence of pulmonary embolus, aortic dissection or other significant vascular abnormality. Mild interstitial pulmonary edema. 4 mm right lower lobe subpleural pulmonary nodule. If the patient is at high risk for bronchogenic carcinoma, follow-up chest CT at 1 year is recommended. If the patient is at low risk, no follow-up is needed. This recommendation follows the consensus statement: Guidelines for Management of Small  Pulmonary Nodules Detected on CT Scans: A Statement from the Fleischner Society as published in Radiology 2005; 237:395-400. Small hiatal hernia. Electronically Signed   By: Ted Mcalpine M.D.   On: 06/28/2015 09:46   I have personally reviewed and evaluated these images and lab results as part of my medical decision-making.   EKG Interpretation   Date/Time:  Friday June 28 2015 07:06:33 EST Ventricular Rate:  71 PR  Interval:  166 QRS Duration: 102 QT Interval:  376 QTC Calculation: 409 R Axis:   44 Text Interpretation:  Sinus rhythm Low voltage, precordial leads Otherwise  within normal limits When compared with ECG of 06/26/2015, No significant  change was found Confirmed by [facility]  MD, DAVID ([id number]) on 06/28/2015  7:10:26 AM      MDM   Final diagnoses:  Syncope   44 yo male with no significant medical history who presents with concern for syncope. Patient has been seen in the ED 10/24 and 11/22 for similar episodes that occurred while at work. On 10/30 he also presented with the same concern (head and ankle injury after syncope at work) however on 10/30 he left from triage.    DDx for syncope includes cardiac arrhythmia, MI, PE, electrolyte abnormality, hypovolemia including dehydration and anemia/GI bleed, infection.  History is not consistent with seizure.  CXR without signs of pneumothorax or pneumonia however shows mild cardiomegaly.  CT PE study done given CP with syncope and showed no dissection or PE. Given history of becoming lightheaded after going from sitting to standing, feel history is more consistent with orthostatic or vasovagal syncope, however pt with CP, signs of cardiomegaly/possible CHF places him at higher risk, and given patient has had several ED presentations for syncope, feel observation on telemetry ane ECHO is appropriate. Consulted IM unassigned and pt to be admitted for further care.   Pt reports HA after fall, however recently had head CT for similar fall from standing, is not on blood thinners, has normal neurologic exam, is [place] Head CT negative and doubt traumatic intracranial bleed. Hx not consistent with SAH. Given compazine/benadryl.     Alvira Monday, MD 06/28/15 1740

## 2015-06-28 NOTE — Progress Notes (Signed)
  Echocardiogram 2D Echocardiogram has been performed.  Joseph SavoyCasey N Yazlynn Anthony 06/28/2015, 11:10 AM

## 2015-06-28 NOTE — Progress Notes (Signed)
EEG completed; results pending.    

## 2015-06-28 NOTE — ED Notes (Signed)
Pt states that when he stood up from sitting this AM that he had a syncopal episode. Pt reports generalized chest and abdominal pain at this time. Pt is reporting head pain, and cramping in his neck.

## 2015-06-28 NOTE — Progress Notes (Signed)
Attempted to bring patient down for EEG. Pt refusing EEG at this time.

## 2015-06-28 NOTE — ED Notes (Signed)
Pt transported to CT ?

## 2015-06-28 NOTE — ED Notes (Signed)
Pt returned to room, from CT.

## 2015-06-28 NOTE — ED Notes (Signed)
Patient transported to CT 

## 2015-06-28 NOTE — ED Notes (Signed)
Pt transported to xray 

## 2015-06-28 NOTE — H&P (Signed)
Date: 06/28/2015               Patient Name:  Joseph Anthony MRN: [id number]  DOB: 05/17/1971 Age / Sex: 44 y.o., male   PCP: No Pcp Per Patient         Medical Service: Internal Medicine Teaching Service         Attending Physician: Dr. Burns SpainElizabeth A Butcher, MD    First Contact: Dr. Darreld McleanVishal Mirayah Wren Pager: [phone]  Second Contact: Dr. Hyacinth Meekerasrif Ahmed Pager: [phone]       After Hours (After 5p/  First Contact Pager: [phone]  weekends / holidays): Second Contact Pager: [phone]   Chief Complaint: Syncope and chest pain  History of Present Illness: Mr. 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 He reports 3 episodes of syncope, with initial occurrence 1 month ago. [facility] at the time and workup including EKG, Troponins, and CT head were negative. He reports recurrence of symptoms about 1-2 weeks ago when he was at work Conservation officer, historic buildings(construction) when he began to have a sharp/stabbing pain in his chest and head shortly after stepping out of a bulldozer. His pain was located at the sternum without radiation and as he was walking to a chair, he passed out. His coworkers told him he was out for about 45 seconds to 1 minute and when he awoke he felt weak, confused, and vomited. They did not report any tremors/spasms/shakes. He was then seen in the ED and workup was again negative. He was referred to Neurology for further outpatient evaluation.   Patient now returns to ED after another syncopal episode this morning while he was at work. He says he was standing up from his desk when he began to feel dizzy, numb, and weak. He says he lost consciousness and hit the floor. When he awoke, his boss told him he was "talking crazy." He is unsure if he hit his head, but is reporting a posterior headache that feels like  someone is hitting him. He has continued chest pain that is worse with movement, constant, and 8/10. His pain not associated with exertion or positional change. He also feels lightheaded while laying down, but denies feeling of room spinning, decreased hearing, or tinnitus.  Of note, patient reports the unfortunate loss of his wife and two children this past January in [place]. He saw a mental health provider who prescribed him Wellbutrin and Risperdal about 3 weeks after this incident for depression and hearing voices. Patient says he does not think that these medications provide much help and has only been taking them once in a while (last on Monday). For his childhood seizures, he was previously on Dilantin, but his physician told him he could discontinue it. He also tells us his father died at the age of 43 from a heart attack. He denies alcohol or illicit drug use.  Meds: Current Facility-Administered Medications  Medication Dose Route Frequency Provider Last Rate Last Dose  . acetaminophen (TYLENOL) tablet 500 mg  500 mg Oral Q6H PRN Tasrif Ahmed, MD      . enoxaparin (LOVENOX) injection 40 mg  40 mg Subcutaneous Q24H Tasrif Ahmed, MD      . promethazine (PHENERGAN) tablet 12.5 mg  12.5 mg  Oral Q6H PRN Tasrif Ahmed, MD      . sodium chloride 0.9 % injection 3 mL  3 mL Intravenous Q12H Tasrif Ahmed, MD       Current Outpatient Prescriptions  Medication Sig Dispense Refill  . buPROPion (WELLBUTRIN SR) 200 MG 12 hr tablet Take 200 mg by mouth 2 (two) times daily.    . risperiDONE (RISPERDAL) 1 MG tablet Take 1 mg by mouth 2 (two) times daily.    . traMADol (ULTRAM) 50 MG tablet Take 50 mg by mouth every 6 (six) hours as needed (back pain).      Allergies: Allergies as of 06/28/2015  . (No Known Allergies)   Past Medical History  Diagnosis Date  . Seizure [facility])     had grand mal seizure as a child, stopped taking antiepileptic at age 25  yrs per physical rec.   History reviewed. No pertinent past surgical history. Family History  Problem Relation Age of Onset  . Heart attack Father     died from heart attack at age 77   Social History   Social History  . Marital Status: Single    Spouse Name: N/A  . Number of Children: N/A  . Years of Education: N/A   Occupational History  . Holiday representative    Social History Main Topics  . Smoking status: Current Some Day Smoker -- 0.50 packs/day for 13 years    Types: Cigarettes  . Smokeless tobacco: Not on file  . Alcohol Use: No  . Drug Use: No  . Sexual Activity: Not on file   Other Topics Concern  . Not on file   Social History Narrative    Review of Systems: Review of Systems  Constitutional: Negative for fever and chills.  HENT: Negative for ear pain, hearing loss and tinnitus.   Eyes: Negative for double vision and photophobia.  Respiratory: Negative for cough, shortness of breath and wheezing.   Cardiovascular: Positive for chest pain and palpitations. Negative for orthopnea, leg swelling and PND.  Gastrointestinal: Positive for nausea and vomiting. Negative for heartburn, abdominal pain, diarrhea and constipation.  Musculoskeletal: Positive for falls.  Neurological: Positive for dizziness, loss of consciousness and headaches.     Physical Exam: Blood pressure 112/79, temperature 98.3 F (36.8 C), temperature source Oral, resp. rate 12, height  (1.702 m), weight 240 lb (108.863 kg), SpO2 97 %. Physical Exam  Constitutional: He is oriented to person, place, and time. He appears well-developed and well-nourished.  HENT:  Head: Normocephalic and atraumatic.  Eyes: EOM are normal. Pupils are equal, round, and reactive to light.  Neck: Carotid bruit is not present.  Cardiovascular: Normal rate and regular rhythm.  Exam reveals no gallop and no friction rub.   No murmur heard. Pulmonary/Chest: Effort normal and breath sounds normal. No respiratory  distress. He has no wheezes. He has no rales. He exhibits tenderness.  Abdominal: Soft. Bowel sounds are normal. There is no tenderness.  Musculoskeletal: He exhibits no edema or tenderness.  Mobile, non-tender ~4x3 cm subcutaneous nodule at the nape of the neck. No bruising or infectious appearance.  Neurological: He is alert and oriented to person, place, and time.  Skin: Skin is warm.  Psychiatric: He has a normal mood and affect.     Lab results: Basic Metabolic Panel:  Recent Labs  16/10/96 0018 06/28/15 0738  NA 138 138  K 4.1 3.4*  CL 105 107  CO2 27 24  GLUCOSE 102* 118*  BUN 19 17  CREATININE 1.29* 1.24  CALCIUM 9.1 8.6*   Liver Function Tests:  Recent Labs  06/28/15 0738  AST 28  ALT 16*  ALKPHOS 83  BILITOT 0.7  PROT 6.4*  ALBUMIN 3.4*   No results for input(s): LIPASE, AMYLASE in the last 72 hours. No results for input(s): AMMONIA in the last 72 hours. CBC:  Recent Labs  06/28/15 0738 06/28/15 1023  WBC 5.6 5.4  NEUTROABS 3.6  --   HGB 13.2 13.0  HCT 38.7* 37.2*  MCV 91.3 91.0  PLT 217 211   Cardiac Enzymes: No results for input(s): CKTOTAL, CKMB, CKMBINDEX, TROPONINI in the last 72 hours. BNP: No results for input(s): PROBNP in the last 72 hours. D-Dimer: No results for input(s): DDIMER in the last 72 hours. CBG: No results for input(s): GLUCAP in the last 72 hours. Hemoglobin A1C: No results for input(s): HGBA1C in the last 72 hours. Fasting Lipid Panel:  Recent Labs  06/28/15 1104  CHOL 145  HDL 42  LDLCALC 92  TRIG 57  CHOLHDL 3.5   Thyroid Function Tests: No results for input(s): TSH, T4TOTAL, FREET4, T3FREE, THYROIDAB in the last 72 hours. Anemia Panel: No results for input(s): VITAMINB12, FOLATE, FERRITIN, TIBC, IRON, RETICCTPCT in the last 72 hours. Coagulation: No results for input(s): LABPROT, INR in the last 72 hours. Urine Drug Screen: Drugs of Abuse  No results found for: LABOPIA, COCAINSCRNUR, LABBENZ,  AMPHETMU, THCU, LABBARB  Alcohol Level:  Recent Labs  06/28/15 1023  ETH <5   Urinalysis:  Recent Labs  06/26/15 0003  COLORURINE YELLOW  LABSPEC 1.023  PHURINE 6.0  GLUCOSEU NEGATIVE  HGBUR NEGATIVE  BILIRUBINUR NEGATIVE  KETONESUR NEGATIVE  PROTEINUR NEGATIVE  NITRITE NEGATIVE  LEUKOCYTESUR NEGATIVE    Imaging results:  Dg Chest 1 View  06/28/2015  CLINICAL DATA:  Syncope. EXAM: CHEST 1 VIEW COMPARISON:  05/27/2015. FINDINGS: Mediastinum and hilar structures normal. Borderline cardiomegaly. Borderline pulmonary venous congestion. Bilateral pulmonary interstitial prominence. A mild component congestive heart failure cannot be excluded. Pneumonitis cannot be excluded. Low lung volumes. No pleural effusion or pneumothorax. IMPRESSION: 1. Borderline cardiomegaly and pulmonary venous congestion with mild pulmonary interstitial prominence. Mild component congestive heart failure cannot be excluded. Mild pneumonitis cannot be excluded. 2. Low lung volumes. Electronically Signed   By: Maisie Fus  Register   On: 06/28/2015 07:54   Ct Angio Chest Pe W/cm &/or Wo Cm  06/28/2015  CLINICAL DATA:  Chest pain, shortness of breath and dizziness for the last 3 days. Syncopal episode this morning. EXAM: CT ANGIOGRAPHY CHEST WITH CONTRAST TECHNIQUE: Multidetector CT imaging of the chest was performed using the standard protocol during bolus administration of intravenous contrast. Multiplanar CT image reconstructions and MIPs were obtained to evaluate the vascular anatomy. CONTRAST:  80mL OMNIPAQUE IOHEXOL 350 MG/ML SOLN COMPARISON:  Chest radiograph 06/28/2015 FINDINGS: There is no evidence of pulmonary embolus. There is a small hiatal hernia. There is no evidence of focal lung parenchymal consolidation, pleural effusion or pneumothorax. There is mild pulmonary edema. There is a 4 mm right middle lobe subpleural perifissural nodule pulmonary. There is a mildly prominent right peribronchial lymph node,  nonspecific. The heart is normal in size. Great vessels are normal in caliber. No axillary lymphadenopathy is seen. The visualized portions of the thyroid gland are unremarkable in appearance. The visualized portions of the liver and spleen are unremarkable. The visualized portions of the pancreas, gallbladder, stomach, adrenal glands and kidneys are within normal limits. No acute osseous abnormalities are seen. Stigmata  of diffuse idiopathic skeletal hyperostosis of the thoracic spine is seen. Review of the MIP images confirms the above findings. IMPRESSION: No evidence of pulmonary embolus, aortic dissection or other significant vascular abnormality. Mild interstitial pulmonary edema. 4 mm right lower lobe subpleural pulmonary nodule. If the patient is at high risk for bronchogenic carcinoma, follow-up chest CT at 1 year is recommended. If the patient is at low risk, no follow-up is needed. This recommendation follows the consensus statement: Guidelines for Management of Small Pulmonary Nodules Detected on CT Scans: A Statement from the Fleischner Society as published in Radiology 2005; 237:395-400. Small hiatal hernia. Electronically Signed   By: Ted Mcalpine M.D.   On: 06/28/2015 09:46    Other results: EKG: normal sinus rhythm, unchanged from previous tracings.  Assessment & Plan by Problem: Principal Problem:   Syncope Active Problems:   Chest pain   Hx of seizure disorder  Syncope and chest pain: 44 year old male with PMH of childhood Grand-Mal seizures who presents with three episodes of LOC in the last month associated with sternal chest pain, headache, emesis, confusion, and dizziness. Because of patient's childhood history and medications, it is possible his recent symptoms are due to recurrent seizures. He says witnesses to his syncopal episodes did not notice tremors/shakes and he did not have loss of bowel/bladder, but he did report confusion upon regaining consciousness. Another  consideration is orthostatic hypotension/hypovolemia, as patient reports these episodes occurring when getting up from sitting position. He also reports falling and may have hit his head which bring up the possibility of post-concussive symptoms, but does not explain the cause of his syncope. As always, due to patient's reported chest pain and smoking/family history, ACS must be ruled out. However, his chest pain is atypical, reproducible on palpation, and his initial troponin was negative and EKG without acute ischemic changes. Other differentials include arrhythmia and electrolyte abnormalities. -TTE -EEG -Trend Troponins -Repeat EKG in AM -UDS, EtOH -Hgb A1C, HIV, TSH, Lipid panel -Orthostatic vitals -Cardiac monitoring -f/u CBC, BMP    Dispo: Disposition is deferred at this time, awaiting improvement of current medical problems. Anticipated discharge in approximately 1-3 day(s).   The patient does not have a current PCP (No Pcp Per Patient) and does need an [facility] hospital follow-up appointment after discharge.  The patient does not have transportation limitations that hinder transportation to clinic appointments.  Signed: Darreld Mclean, MD 06/28/2015, 11:44 AM

## 2015-06-29 DIAGNOSIS — R55 Syncope and collapse: Principal | ICD-10-CM

## 2015-06-29 LAB — COMPREHENSIVE METABOLIC PANEL
ALBUMIN: 3.2 g/dL — AB (ref 3.5–5.0)
ALK PHOS: 76 U/L (ref 38–126)
ALT: 14 U/L — AB (ref 17–63)
AST: 19 U/L (ref 15–41)
Anion gap: 6 (ref 5–15)
BILIRUBIN TOTAL: 0.6 mg/dL (ref 0.3–1.2)
BUN: 16 mg/dL (ref 6–20)
CALCIUM: 8.5 mg/dL — AB (ref 8.9–10.3)
CO2: 26 mmol/L (ref 22–32)
CREATININE: 1.18 mg/dL (ref 0.61–1.24)
Chloride: 105 mmol/L (ref 101–111)
GFR calc Af Amer: >60 mL/min (ref >60)
GLUCOSE: 83 mg/dL (ref 65–99)
Potassium: 3.8 mmol/L (ref 3.5–5.1)
Sodium: 137 mmol/L (ref 135–145)
TOTAL PROTEIN: 6.3 g/dL — AB (ref 6.5–8.1)

## 2015-06-29 LAB — HIV ANTIBODY (ROUTINE TESTING W REFLEX): HIV Screen 4th Generation wRfx: NONREACTIVE

## 2015-06-29 LAB — HEMOGLOBIN A1C
Hgb A1c MFr Bld: 5.4 % (ref 4.8–5.6)
Mean Plasma Glucose: 108 mg/dL

## 2015-06-29 LAB — CBC
HCT: 37.6 % — ABNORMAL LOW (ref 39.0–52.0)
Hemoglobin: 12.9 g/dL — ABNORMAL LOW (ref 13.0–17.0)
MCH: 31.2 pg (ref 26.0–34.0)
MCHC: 34.3 g/dL (ref 30.0–36.0)
MCV: 91 fL (ref 78.0–100.0)
Platelets: 210 10*3/uL (ref 150–400)
RBC: 4.13 MIL/uL — ABNORMAL LOW (ref 4.22–5.81)
RDW: 13.8 % (ref 11.5–15.5)
WBC: 5.2 10*3/uL (ref 4.0–10.5)

## 2015-06-29 LAB — MAGNESIUM: Magnesium: 1.9 mg/dL (ref 1.7–2.4)

## 2015-06-29 MED ORDER — DOCUSATE SODIUM 100 MG PO CAPS
100.0000 mg | ORAL_CAPSULE | Freq: Every day | ORAL | Status: DC
  Filled 2015-06-29: qty 1

## 2015-06-29 MED ORDER — SODIUM CHLORIDE 0.9 % IV SOLN
INTRAVENOUS | Status: AC
  Administered 2015-06-29: 08:00:00 via INTRAVENOUS

## 2015-06-29 MED ORDER — BISACODYL 5 MG PO TBEC
5.0000 mg | DELAYED_RELEASE_TABLET | Freq: Every day | ORAL | Status: DC | PRN
  Administered 2015-06-29: 5 mg via ORAL
  Filled 2015-06-29 (×3): qty 1

## 2015-06-29 MED ORDER — PROMETHAZINE HCL 25 MG/ML IJ SOLN: 12.5000 mg | Freq: Four times a day (QID) | INTRAMUSCULAR | Status: DC | PRN

## 2015-06-29 MED ORDER — PROMETHAZINE HCL 25 MG/ML IJ SOLN
12.5000 mg | Freq: Once | INTRAMUSCULAR | Status: AC
  Administered 2015-06-29: 12.5 mg via INTRAVENOUS
  Filled 2015-06-29: qty 1

## 2015-06-29 MED ORDER — BISACODYL 5 MG PO TBEC: 5.0000 mg | DELAYED_RELEASE_TABLET | Freq: Every day | ORAL | Status: DC | PRN

## 2015-06-29 MED ORDER — KETOROLAC TROMETHAMINE 10 MG PO TABS
10.0000 mg | ORAL_TABLET | Freq: Four times a day (QID) | ORAL | Status: DC | PRN
  Administered 2015-06-29: 10 mg via ORAL
  Filled 2015-06-29 (×3): qty 1

## 2015-06-29 NOTE — Progress Notes (Signed)
Pt refusing orthostatic vitals, and daily weight at this time. Will continue to try.

## 2015-06-29 NOTE — Progress Notes (Signed)
   Subjective: Patient says he still has dizziness, but now reports that it happens when he is lying down. When seen today, he was able to walk without issue down the hall. He is upset that he is not receiving his preferred food while here. We will try to accommodate him, but advise that he needs to cooperate with our nursing staff while he is here. Objective: Vital signs in last 24 hours: Filed Vitals:   06/28/15 2016 06/29/15 0019 06/29/15 0450 06/29/15 1108  BP: 99/59 99/54  117/82  Pulse: 73 66 66   Temp: 98.8 F (37.1 C) 99 F (37.2 C) 98 F (36.7 C)   TempSrc: Oral Oral Oral   Resp: 18 20    Height:      Weight:      SpO2: 98% 96% 100%    Weight change: 7 lb 11.2 oz (3.493 kg)  Intake/Output Summary (Last 24 hours) at 06/29/15 1255 Last data filed at 06/29/15 0019  Gross per 24 hour  Intake   1200 ml  Output      0 ml  Net   1200 ml   General: sitting up on bed after walking down the hall from front desk Cardiac: RRR, no rubs, murmurs or gallops Pulm: clear to auscultation bilaterally Neuro: alert and oriented X3  Assessment/Plan: Principal Problem:   Syncope Active Problems:   Chest pain   Hx of seizure disorder  Syncope: Patient with history of childhood seizures, not currently on anti-epileptic medications. It is possible his syncopal episodes are due to seizure, but he says witnesses did not describe seizure-like activity. He has been taking Tramadol and Wellbutrin on and off, which can lower the seizure threshold. He reports a family history of male relatives dying at young ages due to heart disease (father died age 43 from MI, brother died from aneurysm, Grandfather died in his 8150s due to heart disease). Because of this history, we will ask Cardiology to see him and follow up on recommendations. He has mild orthostasis with increased HR. TTE, EKG, and CTA without remarkable findings for ischemic changes, PE, or . He has a follow up Neurology appointment on Dec. 17.   -Cardiology consulted, appreciate following and recommendations -Telemetry -f/u EEG results   Dispo: Disposition is deferred at this time, awaiting Cardiology recommendations  The patient does not have a current PCP (No Pcp Per Patient) and [facility] after discharge.    Darreld McleanVishal Michaell Grider, MD 06/29/2015, 12:55 PM

## 2015-06-29 NOTE — Progress Notes (Signed)
  Date: 06/29/2015  Patient name: Joseph Anthony  Medical record number: [id number]  Date of birth: 09-24-1970   I have seen and evaluated Joseph NapoleonMaurice Tensley and discussed their care with the Residency Team. Mr Roger ShelterGordon is a 44 yo male admitted with his 3 episode of syncope since Oct 24th. Today he states that all episodes began with him felling fine, making a positional change (standing, getting out of forklift), and then passing out. No sz activity was described by bystanders. He was confused and talking nonsense afterwards. Assoc sxs inc stabbing CP. Today, he states he  Eels dizzy and walking helps. He does state he has some CP. Of note, in Sep he was Rx'd Wellbutrin and Risperdal and ultram. 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 And he stated last took ultram almost 2 weeks ago.Dr Allena KatzPatel documented the loss of his family in Jan 2015.   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 Sister is Charity fundraiserN [place].  Fam Hs : father died AMI age 43. Brother died young 2/2 aneurysm  Filed Vitals:   06/29/15 0450 06/29/15 1108  BP:  117/82  Pulse: 66   Temp: 98 F (36.7 C)   Resp:     Gen : standing at RN station when we entered floor. Able to walk to room at end of hall. Gait steady. HRRR no MRG Neuro : A&O, No focal deficits, gait nl, speech nl, affect nl.  CT chest negative for PE, aortic dissection EKG : sinus, ? Early replo lateral leads Tele : sinus arrhythmia UDS negative Trop negative x 3 A1C 5.4 Electrolytes no sig abnl ECHO nl  Assessment and Plan: I have seen and evaluated the patient as outlined above. I agree with the formulated Assessment and Plan as detailed in the residents' admission note, with the following changes:   1. Syncope - Per pt this is the 3rd episode in one month. He has come to ED 4 times (left without being seen on 10/30). There are some  inconsistencies when reading ED notes. However, he had a h/o szs and he has a sig fam hx for early CV death in males in his fam per pt which concerns me and is the basis for cardiology consult. Before calling this conversion d/o or claiming it is for secondary gain, he warrants a cardiac eval inpt and will see neuro as outpt (but getting EEG inpt and he will tell him not to drive, operate machinery, or climb ladders / structure.).  Diff dx for syncope include 1. Sz - per pt, no sz activity described by bystanders, CT negative. No szs since a teen. EEG pending. Will not start anti-epileptics. 2. Cardiac arrhythmia - still a possibility. No abnl on tele. Cardiac consult 3. Orthostatic hypotension - he is barely orthostatic by HR. No sense that a 44 yo would be vol contracted and pt endorses drinking 2 L water daily. Could be autonomic instability - would need further testing like tilt table - cards consult. 4. PE - R/O with CT 5. AMI - R/O Trop I and EKG 6. CVA - CT negative 7. 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

## 2015-06-29 NOTE — Consult Note (Signed)
Consulting cardiologist: Dr Dina RichJonathan Emalina Dubreuil MD  Clinical Summary Mr. Roger ShelterGordon is a 44 y.o.male history of childhood seizures, tobacco abuse, and prior syncopal episodes admitted with syncope. Reports 3 episodes of syncope over the last month.    First episode occurred while at work. He was unloading towels off a truck. Prior to this episode he denies any significant prodrome. He thinks he was unconscious for approx 45 seconds then came too. He noted he had urinated on himself.   Second episode occurred while at work. He was walking around and suddenly felt a sharp stabbing pain in mid chest radiating to back, palpitations, and SOB. He then lost consciouscness. Reports when he came too he continued to have palpitations.  Third episode occurred while sitting in a chair at work. Suddenly felt weak all over. He stood up from the chair and then collapsed.   He reports he walks/jogs up to 2 miles daily without any troubles. Remains very active at work and at home. He reports a father who died from an MI at age 43. He also thinks he had a heart issue when he was 6 that [facility] was [facility], but is unsure of the details.   Hgb 13.2, Plt 217, K 3.4, Cr 1.24, TSH 0.77, Mg 1.7, trop neg x2, UDS neg,  CT PE no PE CXR borderline cardiomegaly, cannot exclude mild CHF 06/28/15 echo: LVEF 55-60%, no WMAs, normal diastolic function EKG NSR, normal QTc Orthostatic by pulse.    No Known Allergies  Medications Scheduled Medications: . enoxaparin (LOVENOX) injection  40 mg Subcutaneous Q24H  . sodium chloride  3 mL Intravenous Q12H     Infusions: . sodium chloride 100 mL/hr at 06/29/15 0829     PRN Medications:  acetaminophen, bisacodyl, promethazine   Past Medical History  Diagnosis Date  . Seizure [facility](HCC)     had grand mal seizure as a child, stopped taking antiepileptic at age 44 yrs per physical rec.    History reviewed. No pertinent past surgical history.  Family History    Problem Relation Age of Onset  . Heart attack Father     died from heart attack at age 43    Social History Mr. Roger ShelterGordon reports that he has been smoking Cigarettes.  He has a 6.5 pack-year smoking history. He does not have any smokeless tobacco history on file. Mr. Roger ShelterGordon reports that he does not drink alcohol.  Review of Systems CONSTITUTIONAL: No weight loss, fever, chills, weakness or fatigue.  HEENT: Eyes: No visual loss, blurred vision, double vision or yellow sclerae. No hearing loss, sneezing, congestion, runny nose or sore throat.  SKIN: No rash or itching.  CARDIOVASCULAR:per HPI RESPIRATORY: No shortness of breath, cough or sputum.  GASTROINTESTINAL: No anorexia, nausea, vomiting or diarrhea. No abdominal pain or blood.  GENITOURINARY: no polyuria, no dysuria NEUROLOGICAL: per HPI MUSCULOSKELETAL: No muscle, back pain, joint pain or stiffness.  HEMATOLOGIC: No anemia, bleeding or bruising.  LYMPHATICS: No enlarged nodes. No history of splenectomy.  PSYCHIATRIC: No history of depression or anxiety.      Physical Examination Blood pressure 117/82, pulse 66, temperature 98 F (36.7 C), temperature source Oral, resp. rate 20, height 5\' 7"  (1.702 m), weight 247 lb 11.2 oz (112.356 kg), SpO2 100 %.  Intake/Output Summary (Last 24 hours) at 06/29/15 1222 Last data filed at 06/29/15 0019  Gross per 24 hour  Intake   1200 ml  Output      0 ml  Net   1200 ml    HEENT: sclera clear  Cardiovascular: RRR, no m/r/g, no jvd  Respiratory: CTAB  GI: abdomen soft, NT, ND  MSK: no LE edema  Neuro: no focal deficits  Psych: appropriate affect   Lab Results  Basic Metabolic Panel:  Recent Labs Lab 06/26/15 0018 06/28/15 0738 06/28/15 1340 06/29/15 0320  NA 138 138  --  137  K 4.1 3.4*  --  3.8  CL 105 107  --  105  CO2 27 24  --  26  GLUCOSE 102* 118*  --  83  BUN 19 17  --  16  CREATININE 1.29* 1.24  --  1.18  CALCIUM 9.1 8.6*  --  8.5*  MG  --   --  1.7  1.9    Liver Function Tests:  Recent Labs Lab 06/28/15 0738 06/29/15 0320  AST 28 19  ALT 16* 14*  ALKPHOS 83 76  BILITOT 0.7 0.6  PROT 6.4* 6.3*  ALBUMIN 3.4* 3.2*    CBC:  Recent Labs Lab 06/26/15 0018 06/28/15 0738 06/28/15 1023 06/29/15 0320  WBC 8.5 5.6 5.4 5.2  NEUTROABS  --  3.6  --   --   HGB 14.2 13.2 13.0 12.9*  HCT 40.8 38.7* 37.2* 37.6*  MCV 90.5 91.3 91.0 91.0  PLT 234 217 211 210    Cardiac Enzymes:  Recent Labs Lab 06/28/15 1340 06/28/15 1932  TROPONINI <0.03 <0.03    BNP: Invalid input(s): POCBNP     Impression/Recommendations 1. Syncope - unclear etiology. Orthostatics are abnormal by pulse, however one episode occurred while sitting in a chair which would not suggest orthostatsis as the etiology. Continue hydration, should increase his electrolyte rich fluid intake at home (ex. G2, powerade 0) - normal echo. Baseline EKG NSR with no block, normal QTc. Telemetry in the early AM hours does show some sinus brady to high 40s, short runs of atach, but otherwise no signicant arrhythmias - he is otherwise very active, walking/running up to 2 miles daily without symptoms - concern of possible cardiac arrhythmia remains. Monitor on tele today, likely will need 30 day event monitor at discharge. Possible seizure activity being worked up by primary team with EEG.   Dina Rich, M.D

## 2015-06-29 NOTE — Progress Notes (Signed)
Patient calm cooperative, no distress or pain.  Call light within reach.

## 2015-06-29 NOTE — Evaluation (Signed)
Physical Therapy Evaluation and Discharge Patient Details Name: Joseph Anthony MRN: [id number] DOB: 11-05-1970 Today's Date: 06/29/2015   History of Present Illness  pt presents with multiple Syncopal Episodes and hx of Seizures as a child.    Clinical Impression  Pt demonstrates good strength and overall mobility.  No LOB or gait deficits noted until PT commented about how well pt was doing.  Then pt began to c/o pain in Bil LEs, back, and neck, as well as starting to ambulate with a more stiff gait pattern.  Found a partially dissolved pill sitting on pt's recliner.  Pill given to Nsg staff.  Feel pt does not have any PT needs at this time and will sign off.      Follow Up Recommendations No PT follow up    Equipment Recommendations  None recommended by PT    Recommendations for Other Services       Precautions / Restrictions Precautions Precautions: None Restrictions Weight Bearing Restrictions: No      Mobility  Bed Mobility Overal bed mobility: Independent                Transfers Overall transfer level: Independent Equipment used: None             General transfer comment: pt indicates dizziness upon stnading and tells PT it normally lasts 3-5 mins.    Ambulation/Gait Ambulation/Gait assistance: Modified independent (Device/Increase time) Ambulation Distance (Feet): 200 Feet Assistive device: None Gait Pattern/deviations: Step-through pattern;Decreased stride length     General Gait Details: pt moves slowly without deficit until PT mentions how good pt is doing.  Then pt ebgins to ambulate more stiffly and begins to c/o pain in Bil LEs, back, and neck when previously he had indicated only "stiffness" in his LEs.    Stairs            Wheelchair Mobility    Modified Rankin (Stroke Patients Only)       Balance Overall balance assessment: No apparent balance deficits (not formally assessed)                                           Pertinent Vitals/Pain Pain Assessment: 0-10 Pain Score: 8  Pain Location: Bil LEs, Back, and neck.   Pain Descriptors / Indicators: Tightness;Sore Pain Intervention(s): Monitored during session;Repositioned    Home Living Family/patient expects to be discharged to:: Private residence Living Arrangements: Alone               Additional Comments: pt in town for work and living at a hotel.  [place]      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     Hand Dominance        Extremity/Trunk Assessment   Upper Extremity Assessment: Defer to OT evaluation           Lower Extremity Assessment: Overall WFL for tasks assessed      Cervical / Trunk Assessment: Normal  Communication   Communication: No difficulties  Cognition Arousal/Alertness: Awake/alert Behavior During Therapy: WFL for tasks assessed/performed Overall Cognitive Status: Within Functional Limits for tasks assessed                      General Comments      Exercises  Assessment/Plan    PT Assessment Patent does not need any further PT services  PT Diagnosis Difficulty walking   PT Problem List    PT Treatment Interventions     PT Goals (Current goals can be found in the Care Plan section) Acute Rehab PT Goals Patient Stated Goal: Find out what is wrong with me. PT Goal Formulation: All assessment and education complete, DC therapy    Frequency     Barriers to discharge        Co-evaluation               End of Session Equipment Utilized During Treatment: Gait belt Activity Tolerance: Patient tolerated treatment well Patient left: in bed;with call bell/phone within reach Nurse Communication: Mobility status    Functional Assessment Tool Used: Clinical Judgement Functional Limitation: Mobility: Walking and moving around Mobility: Walking and Moving Around Current Status (U0454):  0 percent impaired, limited or restricted Mobility: Walking and Moving Around Goal Status [phone]): 0 percent impaired, limited or restricted Mobility: Walking and Moving Around Discharge Status [phone]): 0 percent impaired, limited or restricted    Time: 1340-1356 PT Time Calculation (min) (ACUTE ONLY): 16 min   Charges:   PT Evaluation $Initial PT Evaluation Tier I: 1 Procedure     PT G Codes:   PT G-Codes **NOT FOR INPATIENT CLASS** Functional Assessment Tool Used: Clinical Judgement Functional Limitation: Mobility: Walking and moving around Mobility: Walking and Moving Around Current Status (G9562): 0 percent impaired, limited or restricted Mobility: Walking and Moving Around Goal Status (Z3086): 0 percent impaired, limited or restricted Mobility: Walking and Moving Around Discharge Status (V7846): 0 percent impaired, limited or restricted    Sunny Schlein, [place] [phone] 06/29/2015, 2:08 PM

## 2015-06-29 NOTE — Progress Notes (Signed)
Pt called this RN to his room, pt agitated and thrashing left arm about. Pt states, "it hurts, but its numb". when asked level of pain, pt states, " take this IV out, stop that (pointing at IV fluids). This RN stopped fluids at this time. Upon assessment Left arm IV (New iv) blood return, no signs or symptoms of infiltration. Explained to patient that" its necessary, especially at this time to keep his IV in incase of an emergency. " Patient states once again when asked about type, area, and level of pain, that "its NOT chest pain, its elbow down, hand cramps, its numb, but sharp pain." This RN paged, MD, MD to bedside.  As this RN entered room from talking with MD, RN noticed emesis (undigested food) on floor at bedside. MD at bedside, gave verbal order for IV phenergan. Upon returning with IV Phenergan, more emesis in basin (undigested food). This RN gave iv Phenergan.  Patient complaining of Chest pain after vomiting. Patient states that he does not want an EKG. This RN called MD, verbal orders to get an EKG but hold off on any Nitro SL. EKG obtained, CP resolved, EKG: NSR. Called MD to make aware. No new orders at this time.

## 2015-06-30 ENCOUNTER — Emergency Department (HOSPITAL_COMMUNITY): Payer: Self-pay

## 2015-06-30 ENCOUNTER — Encounter (HOSPITAL_COMMUNITY): Payer: Self-pay

## 2015-06-30 ENCOUNTER — Emergency Department (HOSPITAL_COMMUNITY)
Admission: EM | Admit: 2015-06-30 | Discharge: 2015-07-01 | Disposition: A | Discharge status: Home / Self Care | Payer: Self-pay | Attending: Emergency Medicine | Admitting: Emergency Medicine

## 2015-06-30 DIAGNOSIS — R4020 Unspecified coma: Secondary | ICD-10-CM

## 2015-06-30 DIAGNOSIS — R0602 Shortness of breath: Secondary | ICD-10-CM | POA: Insufficient documentation

## 2015-06-30 DIAGNOSIS — F17210 Nicotine dependence, cigarettes, uncomplicated: Secondary | ICD-10-CM | POA: Insufficient documentation

## 2015-06-30 DIAGNOSIS — R531 Weakness: Secondary | ICD-10-CM | POA: Insufficient documentation

## 2015-06-30 DIAGNOSIS — M542 Cervicalgia: Secondary | ICD-10-CM | POA: Insufficient documentation

## 2015-06-30 DIAGNOSIS — Z8669 Personal history of other diseases of the nervous system and sense organs: Secondary | ICD-10-CM

## 2015-06-30 DIAGNOSIS — R42 Dizziness and giddiness: Secondary | ICD-10-CM | POA: Insufficient documentation

## 2015-06-30 DIAGNOSIS — R079 Chest pain, unspecified: Secondary | ICD-10-CM | POA: Insufficient documentation

## 2015-06-30 DIAGNOSIS — R51 Headache: Secondary | ICD-10-CM | POA: Insufficient documentation

## 2015-06-30 DIAGNOSIS — R55 Syncope and collapse: Secondary | ICD-10-CM | POA: Insufficient documentation

## 2015-06-30 DIAGNOSIS — R6883 Chills (without fever): Secondary | ICD-10-CM | POA: Insufficient documentation

## 2015-06-30 HISTORY — DX: Syncope and collapse: R55

## 2015-06-30 LAB — RAPID URINE DRUG SCREEN, HOSP PERFORMED
AMPHETAMINES: NOT DETECTED
BENZODIAZEPINES: NOT DETECTED
Barbiturates: NOT DETECTED
COCAINE: NOT DETECTED
OPIATES: NOT DETECTED
TETRAHYDROCANNABINOL: NOT DETECTED

## 2015-06-30 LAB — BASIC METABOLIC PANEL
ANION GAP: 6 (ref 5–15)
BUN: 16 mg/dL (ref 6–20)
CHLORIDE: 104 mmol/L (ref 101–111)
CO2: 27 mmol/L (ref 22–32)
Calcium: 8.7 mg/dL — ABNORMAL LOW (ref 8.9–10.3)
Creatinine, Ser: 1.16 mg/dL (ref 0.61–1.24)
GFR calc Af Amer: >60 mL/min (ref >60)
GFR calc non Af Amer: >60 mL/min (ref >60)
GLUCOSE: 95 mg/dL (ref 65–99)
Potassium: 3.6 mmol/L (ref 3.5–5.1)
Sodium: 137 mmol/L (ref 135–145)

## 2015-06-30 LAB — I-STAT CHEM 8, ED
BUN: 18 mg/dL (ref 6–20)
CALCIUM ION: 1.15 mmol/L (ref 1.12–1.23)
CHLORIDE: 100 mmol/L — AB (ref 101–111)
Creatinine, Ser: 1.2 mg/dL (ref 0.61–1.24)
Glucose, Bld: 94 mg/dL (ref 65–99)
HEMATOCRIT: 44 % (ref 39.0–52.0)
Hemoglobin: 15 g/dL (ref 13.0–17.0)
POTASSIUM: 3.8 mmol/L (ref 3.5–5.1)
SODIUM: 140 mmol/L (ref 135–145)
TCO2: 26 mmol/L (ref 0–100)

## 2015-06-30 LAB — I-STAT TROPONIN, ED: Troponin i, poc: 0 ng/mL (ref 0.00–0.08)

## 2015-06-30 MED ORDER — BISACODYL 5 MG PO TBEC: 5.0000 mg | DELAYED_RELEASE_TABLET | Freq: Every day | ORAL | Status: AC | PRN

## 2015-06-30 MED ORDER — ACETAMINOPHEN 500 MG PO TABS: 500.0000 mg | ORAL_TABLET | Freq: Four times a day (QID) | ORAL | Status: AC | PRN
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
## 2015-06-30 NOTE — Progress Notes (Signed)
Subjective:  Denies SSCP, palpitations or Dyspnea   Objective:  Filed Vitals:   06/29/15 1500 06/29/15 1840 06/29/15 2000 06/30/15 0500  BP: 125/67 106/65  103/49  Pulse: 62   62  Temp: 97.9 F (36.6 C)  98.6 F (37 C) 98.7 F (37.1 C)  TempSrc: Oral     Resp: 18  18 16   Height:      Weight:      SpO2: 100%  98% 98%    Intake/Output from previous day:  Intake/Output Summary (Last 24 hours) at 06/30/15 0743 Last data filed at 06/30/15 0500  Gross per 24 hour  Intake   2293 ml  Output      0 ml  Net   2293 ml    Physical Exam: Affect appropriate Healthy:  appears stated age HEENT: normal Neck supple with no adenopathy JVP normal no bruits no thyromegaly Lungs clear with no wheezing and good diaphragmatic motion Heart:  S1/S2 no murmur, no rub, gallop or click PMI normal Abdomen: benighn, BS positve, no tenderness, no AAA no bruit.  No HSM or HJR Distal pulses intact with no bruits No edema Neuro non-focal Skin warm and dry No muscular weakness   Lab Results: Basic Metabolic Panel:  Recent Labs  16/05/9610/25/16 1340 06/29/15 0320 06/30/15 0332  NA  --  137 137  K  --  3.8 3.6  CL  --  105 104  CO2  --  26 27  GLUCOSE  --  83 95  BUN  --  16 16  CREATININE  --  1.18 1.16  CALCIUM  --  8.5* 8.7*  MG 1.7 1.9  --    Liver Function Tests:  Recent Labs  06/28/15 0738 06/29/15 0320  AST 28 19  ALT 16* 14*  ALKPHOS 83 76  BILITOT 0.7 0.6  PROT 6.4* 6.3*  ALBUMIN 3.4* 3.2*   No results for input(s): LIPASE, AMYLASE in the last 72 hours. CBC:  Recent Labs  06/28/15 0738 06/28/15 1023 06/29/15 0320  WBC 5.6 5.4 5.2  NEUTROABS 3.6  --   --   HGB 13.2 13.0 12.9*  HCT 38.7* 37.2* 37.6*  MCV 91.3 91.0 91.0  PLT 217 211 210   Cardiac Enzymes:  Recent Labs  06/28/15 1340 06/28/15 1932  TROPONINI <0.03 <0.03   Hemoglobin A1C:  Recent Labs  06/28/15 1023  HGBA1C 5.4   Fasting Lipid Panel:  Recent Labs  06/28/15 1104  CHOL 145    HDL 42  LDLCALC 92  TRIG 57  CHOLHDL 3.5   Thyroid Function Tests:  Recent Labs  06/28/15 1023  TSH 0.772    Imaging: Dg Chest 1 View  06/28/2015  CLINICAL DATA:  Syncope. EXAM: CHEST 1 VIEW COMPARISON:  05/27/2015. FINDINGS: Mediastinum and hilar structures normal. Borderline cardiomegaly. Borderline pulmonary venous congestion. Bilateral pulmonary interstitial prominence. A mild component congestive heart failure cannot be excluded. Pneumonitis cannot be excluded. Low lung volumes. No pleural effusion or pneumothorax. IMPRESSION: 1. Borderline cardiomegaly and pulmonary venous congestion with mild pulmonary interstitial prominence. Mild component congestive heart failure cannot be excluded. Mild pneumonitis cannot be excluded. 2. Low lung volumes. Electronically Signed   By: Maisie Fushomas  Register   On: 06/28/2015 07:54   Ct Angio Chest Pe W/cm &/or Wo Cm  06/28/2015  CLINICAL DATA:  Chest pain, shortness of breath and dizziness for the last 3 days. Syncopal episode this morning. EXAM: CT ANGIOGRAPHY CHEST WITH CONTRAST TECHNIQUE: Multidetector CT imaging of the  chest was performed using the standard protocol during bolus administration of intravenous contrast. Multiplanar CT image reconstructions and MIPs were obtained to evaluate the vascular anatomy. CONTRAST:  80mL OMNIPAQUE IOHEXOL 350 MG/ML SOLN COMPARISON:  Chest radiograph 06/28/2015 FINDINGS: There is no evidence of pulmonary embolus. There is a small hiatal hernia. There is no evidence of focal lung parenchymal consolidation, pleural effusion or pneumothorax. There is mild pulmonary edema. There is a 4 mm right middle lobe subpleural perifissural nodule pulmonary. There is a mildly prominent right peribronchial lymph node, nonspecific. The heart is normal in size. Great vessels are normal in caliber. No axillary lymphadenopathy is seen. The visualized portions of the thyroid gland are unremarkable in appearance. The visualized portions of  the liver and spleen are unremarkable. The visualized portions of the pancreas, gallbladder, stomach, adrenal glands and kidneys are within normal limits. No acute osseous abnormalities are seen. 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 Review of the MIP images confirms the above findings. IMPRESSION: No evidence of pulmonary embolus, aortic dissection or other significant vascular abnormality. Mild interstitial pulmonary edema. 4 mm right lower lobe subpleural pulmonary nodule. If the patient is at high risk for bronchogenic carcinoma, follow-up chest CT at 1 year is recommended. If the patient is at low risk, no follow-up is needed. This recommendation follows the consensus statement: Guidelines for Management of Small Pulmonary Nodules Detected on CT Scans: A Statement from the Fleischner Society as published in Radiology 2005; 237:395-400. Small hiatal hernia. Electronically Signed   By: Ted Mcalpine M.D.   On: 06/28/2015 09:46    Cardiac Studies:  ECG:  NSR normal ECG normal QT   Telemetry:  NSR no arrhythmia 06/30/2015   Echo:  Normal   Medications:   . enoxaparin (LOVENOX) injection  40 mg Subcutaneous Q24H  . sodium chloride  3 mL Intravenous Q12H       Assessment/Plan:  Syncope:  Doubt cardiac etiology.  Normal exam, ECG, Echo and telemetry with good functional capacity.  Continue w/u for recurrent seizures No further cardiac testing needed No high risk family history and normal QT as well   Charlton Haws 06/30/2015, 7:43 AM

## 2015-06-30 NOTE — Procedures (Signed)
ELECTROENCEPHALOGRAM REPORT  Patient: Joseph Anthony       Room #: 1O103W03 EEG No. ID: [id number] Age: 44 y.o.        Sex: male Referring Physician: Rogelia BogaBUTCHER, E Report Date:  06/30/2015        Interpreting Physician: Aline BrochureSTEWART,Adesuwa Osgood R  History: Joseph Anthony is an 44 y.o. male with a history of generalized seizures during childhood, presenting with 3 episodes of loss of consciousness past month.  Indications for study:  Rule out recrudescence of seizure disorder.  Technique: This is an 18 channel routine scalp EEG performed at the bedside with bipolar and monopolar montages arranged in accordance to the international 10/20 system of electrode placement.   Description: This EEG recording was performed during wakefulness and during sleep. Predominant activity during wakefulness consisted of 11  Hz alpha rhythm with good attenuation with eye opening. Low amplitude fast beta activity was frequently recorded from the frontal and central regions. Photic stimulation produced symmetrical occipital driving response, although minimal.  Hyperventilation produced normal symmetrical slowing response. There was symmetrical slowing of background activity with mixed irregular delta and theta activity diffusely during sleep. Symmetrical vertex waves, sleep spindles and K-complexes are recorded during stage II of sleep. No epileptiform discharges were recorded. There was no abnormal slowing of cerebral activity.  Interpretation: This is a normal EEG recording during wakefulness and during sleep. No evidence of an epileptic disorder was demonstrated. However, a normal EEG recording in and of itself does not rule out seizure disorder.   Venetia MaxonR Verlin Duke M.D. [facility] [phone]

## 2015-06-30 NOTE — ED Notes (Signed)
Pt arrived via [facility] from dinner c/o syncopal episode.  EMS states he completely blacked out, presents in soft collar (towel).  Larey SeatFell forward and is c/o of some anterior chest pain secondary to the fall.  Witnessed fall was less then a minute before he came out of it.

## 2015-06-30 NOTE — Discharge Instructions (Addendum)
Don't take wellbutrin or tramadol anymore. Just take tylenol for headache if you need to.  Follow up with the neurologist and also with our clinic.   Don't drive until you follow up with the neurologist and they clear you to drive.   Syncope Syncope is a medical term for fainting or passing out. This means you lose consciousness and drop to the ground. People are generally unconscious for less than 5 minutes. You may have some muscle twitches for up to 15 seconds before waking up and returning to normal. Syncope occurs more often in older adults, but it can happen to anyone. While most causes of syncope are not dangerous, syncope can be a sign of a serious medical problem. It is important to seek medical care.  CAUSES  Syncope is caused by a sudden drop in blood flow to the brain. The specific cause is often not determined. Factors that can bring on syncope include:  Taking medicines that lower blood pressure.  Sudden changes in posture, such as standing up quickly.  Taking more medicine than prescribed.  Standing in one place for too long.  Seizure disorders.  Dehydration and excessive exposure to heat.  Low blood sugar (hypoglycemia).  Straining to have a bowel movement.  Heart disease, irregular heartbeat, or other circulatory problems.  Fear, emotional distress, seeing blood, or severe pain. SYMPTOMS  Right before fainting, you may:  Feel dizzy or light-headed.  Feel nauseous.  See all white or all black in your field of vision.  Have cold, clammy skin. DIAGNOSIS  Your health care provider will ask about your symptoms, perform a physical exam, and perform an electrocardiogram (ECG) to record the electrical activity of your heart. Your health care provider may also perform other heart or blood tests to determine the cause of your syncope which may include:  Transthoracic echocardiogram (TTE). During echocardiography, sound waves are used to evaluate how blood flows  through your heart.  Transesophageal echocardiogram (TEE).  Cardiac monitoring. This allows your health care provider to monitor your heart rate and rhythm in real time.  Holter monitor. This is a portable device that records your heartbeat and can help diagnose heart arrhythmias. It allows your health care provider to track your heart activity for several days, if needed.  Stress tests by exercise or by giving medicine that makes the heart beat faster. TREATMENT  In most cases, no treatment is needed. Depending on the cause of your syncope, your health care provider may recommend changing or stopping some of your medicines. HOME CARE INSTRUCTIONS  Have someone stay with you until you feel stable.  Do not drive, use machinery, or play sports until your health care provider says it is okay.  Keep all follow-up appointments as directed by your health care provider.  Lie down right away if you start feeling like you might faint. Breathe deeply and steadily. Wait until all the symptoms have passed.  Drink enough fluids to keep your urine clear or pale yellow.  If you are taking blood pressure or heart medicine, get up slowly and take several minutes to sit and then stand. This can reduce dizziness. SEEK IMMEDIATE MEDICAL CARE IF:   You have a severe headache.  You have unusual pain in the chest, abdomen, or back.  You are bleeding from your mouth or rectum, or you have black or tarry stool.  You have an irregular or very fast heartbeat.  You have pain with breathing.  You have repeated fainting or seizure-like  jerking during an episode.  You faint when sitting or lying down.  You have confusion.  You have trouble walking.  You have severe weakness.  You have vision problems. If you fainted, call your local emergency services (911 in [place] Do not drive yourself to the hospital.    This information is not intended to replace advice given to you by your health care  provider. Make sure you discuss any questions you have with your health care provider.   Document Released: 07/20/2005 Document Revised: 12/04/2014 Document Reviewed: 09/18/2011 Elsevier Interactive Patient Education [employer]2016 [employer]

## 2015-06-30 NOTE — Discharge Summary (Signed)
Name: Janne NapoleonMaurice Geralds MRN: [id number] DOB: 11/16/1970 44 y.o. PCP: No Pcp Per Patient  Date of Admission: 06/28/2015  6:47 AM Date of Discharge: 06/30/2015 Attending Physician: Burns SpainElizabeth A Butcher, MD  Discharge Diagnosis:  Principal Problem:   Syncope Active Problems:   Chest pain   Hx of seizure disorder  Discharge Medications:   Medication List    STOP taking these medications        buPROPion 200 MG 12 hr tablet  Commonly known as:  WELLBUTRIN SR     risperiDONE 1 MG tablet  Commonly known as:  RISPERDAL     traMADol 50 MG tablet  Commonly known as:  ULTRAM      TAKE these medications        acetaminophen 500 MG tablet  Commonly known as:  TYLENOL  Take 1 tablet (500 mg total) by mouth every 6 (six) hours as needed for moderate pain or headache.     bisacodyl 5 MG EC tablet  Commonly known as:  DULCOLAX  Take 1 tablet (5 mg total) by mouth daily as needed for moderate constipation.        Disposition and follow-up:   Mr.Mehkai Roger ShelterGordon was discharged from [facility] in Stable condition.  At the hospital follow up visit please address:  1.  Assess how he is doing in terms of dizziness. Make sure he follows up with the neurologist Dr. Allena KatzPatel on Dec 17th.   Ask him to not drive until he is cleared by the neurologist.   2.  Labs / imaging needed at time of follow-up: none  3.  Pending labs/ test needing follow-up: none.  Follow-up Appointments:     Follow-up Information    Schedule an appointment as soon as possible for a visit with [facility].   Contact information:   [address] [place] [phone]      Discharge Instructions: Discharge Instructions    Driving Restrictions    Complete by:  As directed   DO NOT DRIVE FOR NOW UNTIL YOUR NEUROLOGIST CLEARS YOU TO DRIVE.           Consultations: Treatment Team:  Antoine PocheJonathan F Branch, MD  Procedures Performed:  Dg Chest 1  View  06/28/2015  CLINICAL DATA:  Syncope. EXAM: CHEST 1 VIEW COMPARISON:  05/27/2015. FINDINGS: Mediastinum and hilar structures normal. Borderline cardiomegaly. Borderline pulmonary venous congestion. Bilateral pulmonary interstitial prominence. A mild component congestive heart failure cannot be excluded. Pneumonitis cannot be excluded. Low lung volumes. No pleural effusion or pneumothorax. IMPRESSION: 1. Borderline cardiomegaly and pulmonary venous congestion with mild pulmonary interstitial prominence. Mild component congestive heart failure cannot be excluded. Mild pneumonitis cannot be excluded. 2. Low lung volumes. Electronically Signed   By: Maisie Fushomas  Register   On: 06/28/2015 07:54   Ct Angio Chest Pe W/cm &/or Wo Cm  06/28/2015  CLINICAL DATA:  Chest pain, shortness of breath and dizziness for the last 3 days. Syncopal episode this morning. EXAM: CT ANGIOGRAPHY CHEST WITH CONTRAST TECHNIQUE: Multidetector CT imaging of the chest was performed using the standard protocol during bolus administration of intravenous contrast. Multiplanar CT image reconstructions and MIPs were obtained to evaluate the vascular anatomy. CONTRAST:  80mL OMNIPAQUE IOHEXOL 350 MG/ML SOLN COMPARISON:  Chest radiograph 06/28/2015 FINDINGS: There is no evidence of pulmonary embolus. There is a small hiatal hernia. There is no evidence of focal lung parenchymal consolidation, pleural effusion or pneumothorax. There is mild pulmonary edema.  There is a 4 mm right middle lobe subpleural perifissural nodule pulmonary. There is a mildly prominent right peribronchial lymph node, nonspecific. The heart is normal in size. Great vessels are normal in caliber. No axillary lymphadenopathy is seen. The visualized portions of the thyroid gland are unremarkable in appearance. The visualized portions of the liver and spleen are unremarkable. The visualized portions of the pancreas, gallbladder, stomach, adrenal glands and kidneys are within  normal limits. No acute osseous abnormalities are seen. 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 Review of the MIP images confirms the above findings. IMPRESSION: No evidence of pulmonary embolus, aortic dissection or other significant vascular abnormality. Mild interstitial pulmonary edema. 4 mm right lower lobe subpleural pulmonary nodule. If the patient is at high risk for bronchogenic carcinoma, follow-up chest CT at 1 year is recommended. If the patient is at low risk, no follow-up is needed. This recommendation follows the consensus statement: Guidelines for Management of Small Pulmonary Nodules Detected on CT Scans: A Statement from the Fleischner Society as published in Radiology 2005; 237:395-400. Small hiatal hernia. Electronically Signed   By: Ted Mcalpine M.D.   On: 06/28/2015 09:46    2D Echo:   Left ventricle: The cavity size was normal. Wall thickness was normal. Systolic function was normal. The estimated ejection fraction was in the range of 55% to 60%. Wall motion was normal; there were no regional wall motion abnormalities. Left ventricular diastolic function parameters were normal. - Mitral valve: There was mild regurgitation. - Right atrium: The atrium was mildly dilated.   EEG: This is a normal EEG recording during wakefulness and during sleep. No evidence of an epileptic disorder was demonstrated. However, a normal EEG recording in and of itself does not rule out seizure disorder.  Admission HPI:   Mr. 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 He reports 3 episodes of syncope, with initial occurrence 1 month ago. He was seen at the [facility] ED at the time and workup including EKG, Troponins, and CT head were negative. He reports  recurrence of symptoms about 1-2 weeks ago when he was at work Conservation officer, historic buildings) when he began to have a sharp/stabbing pain in his chest and head shortly after stepping out of a bulldozer. His pain was located at the sternum without radiation and as he was walking to a chair, he passed out. His coworkers told him he was out for about 45 seconds to 1 minute and when he awoke he felt weak, confused, and vomited. They did not report any tremors/spasms/shakes. He was then seen in the ED and workup was again negative. He was referred to Neurology for further outpatient evaluation.   Patient now returns to ED after another syncopal episode this morning while he was at work. He says he was standing up from his desk when he began to feel dizzy, numb, and weak. He says he lost consciousness and hit the floor. When he awoke, his boss told him he was "talking crazy." He is unsure if he hit his head, but is reporting a posterior headache that feels like someone is hitting him. He has continued chest pain that is worse with movement, constant, and 8/10. His pain not associated with exertion or positional change. He also feels lightheaded while laying down, but  denies feeling of room spinning, decreased hearing, or tinnitus.  Of note, patient reports the unfortunate loss of his wife and two children this past January in a motor vehicle accident in [place], where he usually lives. He saw a mental health provider who prescribed him Wellbutrin and Risperdal about 3 weeks after this incident for depression and hearing voices. Patient says he does not think that these medications provide much help and has only been taking them once in a while (last on Monday). For his childhood seizures, he was previously on Dilantin, but his physician told him he could discontinue it. He also tells [place] his father died at the age of 30 from a heart attack. He denies alcohol or illicit drug use.  Hospital Course by problem list: Principal  Problem:   Syncope Active Problems:   Chest pain   Hx of seizure disorder   Syncope: unclear etiology. Likely vasovagal/orthostatic as few episodes happened when he was trying to getup from sitting position, but he also has dizziness while sitting and resting. Concerning for cardiac cause as he has family hx of cardiac deaths (father at age 56 and brother at age 40's). ECHO was normal. trops negative. No arrythmias seen on tele. Does have hx of childhood seizures, EEG was done to ruleout seizure as he was recently on tramadol and wellbutrin which can both lower seizure threshold. It was negative.  His dizziness/n/v could be part of post concussive syndrome from his previous fall and ?head injury.  -Cardiology consulted, they don't think this is cardiac etiology given normal echo and no abnormality on tele so far.  - f/up neurology outpatient. F/up with our clinic as his PCP is in [place]. - asked to not take tramadol or wellbutrin anymore. Asked to take tylenol for headache.  - social worker consulted to help him with temporary shelter placement.  Chest pain - had cp on admission but this resolved. Had TTP on exam, likely MSK. trops and ekg negative.   Discharge Vitals:   BP 103/49 mmHg  Pulse 62  Temp(Src) 98.7 F (37.1 C) (Oral)  Resp 16  Ht  (1.702 m)  Wt 247 lb 11.2 oz (112.356 kg)  BMI 38.79 kg/m2  SpO2 98%  Discharge Labs:  Results for orders placed or performed during the hospital encounter of 06/28/15 (from the past 24 hour(s))  Basic metabolic panel     Status: Abnormal   Collection Time: 06/30/15  3:32 AM  Result Value Ref Range   Sodium 137 135 - 145 mmol/L   Potassium 3.6 3.5 - 5.1 mmol/L   Chloride 104 101 - 111 mmol/L   CO2 27 22 - 32 mmol/L   Glucose, Bld 95 65 - 99 mg/dL   BUN 16 6 - 20 mg/dL   Creatinine, Ser 1.61 0.61 - 1.24 mg/dL   Calcium 8.7 (L) 8.9 - 10.3 mg/dL   GFR calc non Af Amer >60 >60 mL/min   GFR calc Af Amer >60 >60 mL/min   Anion gap  6 5 - 15    Signed: Hyacinth Meeker, MD 06/30/2015, 9:10 AM

## 2015-06-30 NOTE — Discharge Instructions (Signed)
Do not drive a motor vehicle. Drink at least 8 glasses of water per day. Eat regular meals throughout the day. Follow-up with the internal medicine clinic tomorrow, 07/01/2015. Also follow-up with a neurologist at your scheduled appointment on 07/20/2015.  Syncope Syncope is a medical term for fainting or passing out. This means you lose consciousness and drop to the ground. People are generally unconscious for less than 5 minutes. You may have some muscle twitches for up to 15 seconds before waking up and returning to normal. Syncope occurs more often in older adults, but it can happen to anyone. While most causes of syncope are not dangerous, syncope can be a sign of a serious medical problem. It is important to seek medical care.  CAUSES  Syncope is caused by a sudden drop in blood flow to the brain. The specific cause is often not determined. Factors that can bring on syncope include:  Taking medicines that lower blood pressure.  Sudden changes in posture, such as standing up quickly.  Taking more medicine than prescribed.  Standing in one place for too long.  Seizure disorders.  Dehydration and excessive exposure to heat.  Low blood sugar (hypoglycemia).  Straining to have a bowel movement.  Heart disease, irregular heartbeat, or other circulatory problems.  Fear, emotional distress, seeing blood, or severe pain. SYMPTOMS  Right before fainting, you may:  Feel dizzy or light-headed.  Feel nauseous.  See all white or all black in your field of vision.  Have cold, clammy skin. DIAGNOSIS  Your health care provider will ask about your symptoms, perform a physical exam, and perform an electrocardiogram (ECG) to record the electrical activity of your heart. Your health care provider may also perform other heart or blood tests to determine the cause of your syncope which may include:  Transthoracic echocardiogram (TTE). During echocardiography, sound waves are used to evaluate  how blood flows through your heart.  Transesophageal echocardiogram (TEE).  Cardiac monitoring. This allows your health care provider to monitor your heart rate and rhythm in real time.  Holter monitor. This is a portable device that records your heartbeat and can help diagnose heart arrhythmias. It allows your health care provider to track your heart activity for several days, if needed.  Stress tests by exercise or by giving medicine that makes the heart beat faster. TREATMENT  In most cases, no treatment is needed. Depending on the cause of your syncope, your health care provider may recommend changing or stopping some of your medicines. HOME CARE INSTRUCTIONS  Have someone stay with you until you feel stable.  Do not drive, use machinery, or play sports until your health care provider says it is okay.  Keep all follow-up appointments as directed by your health care provider.  Lie down right away if you start feeling like you might faint. Breathe deeply and steadily. Wait until all the symptoms have passed.  Drink enough fluids to keep your urine clear or pale yellow.  If you are taking blood pressure or heart medicine, get up slowly and take several minutes to sit and then stand. This can reduce dizziness. SEEK IMMEDIATE MEDICAL CARE IF:   You have a severe headache.  You have unusual pain in the chest, abdomen, or back.  You are bleeding from your mouth or rectum, or you have black or tarry stool.  You have an irregular or very fast heartbeat.  You have pain with breathing.  You have repeated fainting or seizure-like jerking during an episode.  You faint when sitting or lying down.  You have confusion.  You have trouble walking.  You have severe weakness.  You have vision problems. If you fainted, call your local emergency services (911 in [place] Do not drive yourself to the hospital.    This information is not intended to replace advice given to you by your  health care provider. Make sure you discuss any questions you have with your health care provider.   Document Released: 07/20/2005 Document Revised: 12/04/2014 Document Reviewed: 09/18/2011 Elsevier Interactive Patient Education [employer]2016 Elsevier Inc.

## 2015-06-30 NOTE — Progress Notes (Cosign Needed)
   Subjective: Had some nausea yesterday. Still having some dizziness, no change with positioning or any other factors. No chest pain or palpitations. He tells me he has no where to go from the hospital. His wallet is locked up at his job place and everyone he works with went back to [place]. They will not be back until next Friday. 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 He states he will not have any money until Tuesday when he gets paid. Wants to talk to SW about any help for hotel pass.   Objective: Vital signs in last 24 hours: Filed Vitals:   06/29/15 1500 06/29/15 1840 06/29/15 2000 06/30/15 0500  BP: 125/67 106/65  103/49  Pulse: 62   62  Temp: 97.9 F (36.6 C)  98.6 F (37 C) 98.7 F (37.1 C)  TempSrc: Oral     Resp: 18  18 16   Height:      Weight:      SpO2: 100%  98% 98%   Weight change:   Intake/Output Summary (Last 24 hours) at 06/30/15 0735 Last data filed at 06/30/15 0500  Gross per 24 hour  Intake   2293 ml  Output      0 ml  Net   2293 ml   General: sitting in bed, NAD Cardiac: RRR, no rubs, murmurs or gallops Pulm: clear to auscultation bilaterally Neuro: alert and oriented X3  Assessment/Plan: Principal Problem:   Syncope Active Problems:   Chest pain   Hx of seizure disorder  Syncope: unclear etiology. Likely vasovagal/orthostatic as few episodes happened when he was trying to getup from sitting position, but he also has dizziness while sitting and resting. Concerning for cardiac cause as he has family hx of cardiac deaths (father at age 43 and brother at age 43's). ECHO was normal. No significant abnormality seen on tele.  Does have hx of childhood seizures, EEG was done to ruleout seizure as he was recently on tramadol and wellbutrin which can both lower seizure threshold. It was negative.  His dizziness/n/v could be part of post concussive syndrome from his previous fall and ?head  injury.  -Cardiology consulted, they don't think this is cardiac etiology given normal echo and no abnormality on tele so far.  - f/up neurology outpatient. F/up with our clinic as [place] in [place]. - asked to not take tramadol or wellbutrin anymore. Asked to take tylenol for headache.  - social worker consulted to help him with temporary shelter placement.  Dispo: likely discharge today.   The patient does not have a current PCP (No Pcp Per Patient) and does need an [facility] hospital follow-up appointment after discharge.    Joseph Meekerasrif Josilynn Losh, MD 06/30/2015, 7:35 AM

## 2015-07-01 MED ORDER — KETOROLAC TROMETHAMINE 30 MG/ML IJ SOLN
30.0000 mg | Freq: Once | INTRAMUSCULAR | Status: AC
  Administered 2015-07-01: 30 mg via INTRAVENOUS
  Filled 2015-07-01: qty 1

## 2015-07-01 NOTE — ED Notes (Addendum)
Upon d/c pt revealed that he does not have a ride home or anyone here in the city [place] to call for a ride home. This RN attempted to provide patient with a cab voucher and had it approved by Suzy BouchardKaren N, Consulting civil engineerCharge RN. Pt reported that he is here in [place] for business but currently lives in [place]. 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 He stated that his co workers have left [place] and took all of his belongings and locked them in a trailer [facility] property. He does not know where this trailer is.  When this RN went to get information regarding the address of the hotel he is supposed to be staying at for the cab voucher, pt revealed that the hotel he was arranged to stay at has no vacancies. He reported that a Child psychotherapistsocial worker from his previous stay here had arranged for him to stay at [facility] but he called tonight while in the ED and was told it had no vacancies. He reported that the social worker told him this hotel has a Community education officercontract with this hospital and that the social worker had arranged for him a bus pass back to [place].  At the point, the charge RN Luciano CutterKaren Newnam became involved and we instructed him to call his co-workers or supervisors to see if any of them could help arrange him a place to stay at. He suddenly became defensive and angry at this point stating, "theres no use in me calling, I have tried and no one is answering!" We asked for the phone number of his supervisor multiple times but pt would not provide the number. Clydie BraunKaren, RN then proceeded to call the [facility] and was told that the patient is actually banned from that hotel and his reservation had been terminated due to drug activity on the hotel property. When the charge RN asked patient why he was not honest about this patient became more angry and stated he would just leave. This RN walked patient out  of this ED to the waiting room and patient was observed ambulating out of this ED A&OX4 with steady gait and he stated he was going to walk to [place] and call friends in [place].  After a few minutes the nurse first at the ED entrance called this RN to ask if patient was still allowed a cab voucher because the patient was back in the ED asking for the voucher. This RN told nurse first that the patient is no longer allowed a cab voucher and that security was on the way to the entrance to assist patient off the [facility] property.  Additionally, about 5 or 10 minutes after this, another staff member, Rocky LinkKen, EMT reported seeing this patient wandering around the halls on the second floor. Charge RN called security and security found patient on the second floor and again escorted off the property.

## 2015-07-01 NOTE — Progress Notes (Signed)
Date: 07/01/2015               Patient Name:  Joseph Anthony MRN: [id number]  DOB: 1971-04-12 Age / Sex: 44 y.o., male   PCP: No Pcp Per Patient         Requesting Physician: Dr. Marily Memos, MD    Consulting Reason:  Syncope     Chief Complaint: Syncope  History of Present Illness:  Pt just discharged this AM by our service for syncope workup.  He was evaluated by cardiology and neurology.  Cardiology did not think his syncopal episode was cardiac in origin.  He had an EEG which normal and has follow up with neurology in December.    In brief, he was standing in line waiting at [employer] when he began to feel diaphoretic, lightheaded, and SOB.  Denies CP, N/V, or loss of bowels or bladder control.  No tongue biting.  He then states he lost consciousness and fell to the ground.  He is unsure how long he was out but maybe several minutes.  EMS was called and he was transported to the ED.  Denies any drug or alcohol use.  States he has been eating and drinking well.  Denies history of panic attacks.  Labs and EKG unremarkable in the ED.    We attempted to reassure the patient that he had been evaluated by cardiology and neurology and that we would schedule him a close follow up appointment with our clinic for tomorrow.  He did not seem to be reassured by this and states that perhaps the specialists missed something.  He voiced wanting to obtain a second opinion.  Also reported that he was unable to stay at the hotel that the social worker provided a voucher for him.  Stated that the Echo would not accept the voucher from [facility].  He reportedly is staying at the [facility] and that his sister called and was able to get the room for him by giving the hotel her credit card information.  He also is reporting needing transportation back to the hotel which I stated we could arrange tonight.  He said "well I will probably just stay here tonight."   Meds: No current facility-administered  medications for this encounter.   Current Outpatient Prescriptions  Medication Sig Dispense Refill  . acetaminophen (TYLENOL) 500 MG tablet Take 1 tablet (500 mg total) by mouth every 6 (six) hours as needed for moderate pain or headache. 30 tablet 0  . bisacodyl (DULCOLAX) 5 MG EC tablet Take 1 tablet (5 mg total) by mouth daily as needed for moderate constipation. (Patient not taking: Reported on 06/30/2015) 30 tablet 0    Allergies: Allergies as of 06/30/2015  . (No Known Allergies)   Past Medical History  Diagnosis Date  . 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  Marland Kitchen Syncope    History reviewed. No pertinent past surgical history. Family History  Problem Relation Age of Onset  . Heart attack Father     died from heart attack at age 35   Social History   Social History  . Marital Status: Single    Spouse Name: N/A  . Number of Children: N/A  . Years of Education: N/A   Occupational History  . Holiday representative    Social History Main Topics  . Smoking status: Current Some Day Smoker -- 0.50 packs/day for 13 years  Types: Cigarettes  . Smokeless tobacco: Not on file  . Alcohol Use: No  . Drug Use: No  . Sexual Activity: Not on file   Other Topics Concern  . Not on file   Social History Narrative    Review of Systems: Pertinent items are noted in HPI.  Physical Exam: Blood pressure 131/76, pulse 87, temperature 98 F (36.7 C), temperature source Oral, resp. rate 17, SpO2 96 %. Constitutional: Vital signs reviewed.  Patient is a well-developed and well-nourished male sleeping when we went in the room.  He was in no acute distress and cooperative with exam.  Head: Normocephalic and atraumatic Eyes: EOMI, conjunctivae normal, no scleral icterus.  Neck: Supple, Trachea midline .  Cardiovascular: RRR, no MRG Pulmonary/Chest: normal respiratory effort, CTAB, no wheezes, rales, or  rhonchi Abdominal: Soft. Non-tender, non-distended, bowel sounds are normal  Musculoskeletal: No joint deformities noted Neurological: A&O x3, cranial nerve II-XII are grossly intact, moving all extremities  Skin: Warm, dry and intact. Tattoos.  Psychiatric: Agitated.      Lab results: Basic Metabolic Panel:  Recent Labs  16/10/96 1340 06/29/15 0320 06/30/15 0332 06/30/15 2243  NA  --  137 137 140  K  --  3.8 3.6 3.8  CL  --  105 104 100*  CO2  --  26 27  --   GLUCOSE  --  83 95 94  BUN  --  CREATININE  --  1.18 1.16 1.20  CALCIUM  --  8.5* 8.7*  --   MG 1.7 1.9  --   --    Liver Function Tests:  Recent Labs  06/28/15 0738 06/29/15 0320  AST 28 19  ALT 16* 14*  ALKPHOS 83 76  BILITOT 0.7 0.6  PROT 6.4* 6.3*  ALBUMIN 3.4* 3.2*   No results for input(s): LIPASE, AMYLASE in the last 72 hours. No results for input(s): AMMONIA in the last 72 hours. CBC:  Recent Labs  06/28/15 0738 06/28/15 1023 06/29/15 0320 06/30/15 2243  WBC 5.6 5.4 5.2  --   NEUTROABS 3.6  --   --   --   HGB 13.2 13.0 12.9* 15.0  HCT 38.7* 37.2* 37.6* 44.0  MCV 91.3 91.0 91.0  --   PLT 217 211 210  --    Cardiac Enzymes:  Recent Labs  06/28/15 1340 06/28/15 1932  TROPONINI <0.03 <0.03   BNP: No results for input(s): PROBNP in the last 72 hours. D-Dimer: No results for input(s): DDIMER in the last 72 hours. CBG: No results for input(s): GLUCAP in the last 72 hours. Hemoglobin A1C:  Recent Labs  06/28/15 1023  HGBA1C 5.4   Fasting Lipid Panel:  Recent Labs  06/28/15 1104  CHOL 145  HDL 42  LDLCALC 92  TRIG 57  CHOLHDL 3.5   Thyroid Function Tests:  Recent Labs  06/28/15 1023  TSH 0.772   Anemia Panel: No results for input(s): VITAMINB12, FOLATE, FERRITIN, TIBC, IRON, RETICCTPCT in the last 72 hours. Coagulation: No results for input(s): LABPROT, INR in the last 72 hours. Urine Drug Screen: Drugs of Abuse     Component Value Date/Time    LABOPIA NONE DETECTED 06/30/2015 2310   COCAINSCRNUR NONE DETECTED 06/30/2015 2310   LABBENZ NONE DETECTED 06/30/2015 2310   AMPHETMU NONE DETECTED 06/30/2015 2310   THCU NONE DETECTED 06/30/2015 2310   LABBARB NONE DETECTED 06/30/2015 2310    Alcohol Level:  Recent Labs  06/28/15 1023  ETH <5  Urinalysis: No results for input(s): COLORURINE, LABSPEC, PHURINE, GLUCOSEU, HGBUR, BILIRUBINUR, KETONESUR, PROTEINUR, UROBILINOGEN, NITRITE, LEUKOCYTESUR in the last 72 hours.  Invalid input(s): APPERANCEUR  Imaging results:  Dg Chest 2 View  06/30/2015  CLINICAL DATA:  Syncope and shortness of breath today EXAM: CHEST  2 VIEW COMPARISON:  06/28/2015 FINDINGS: Hurt her mild cardiac enlargement similar to prior study. Mild interstitial prominence and vascular congestion also similar. Mildly more prominent left perihilar opacity when compared to prior study and when compared to the contralateral side. No pleural effusion. IMPRESSION: Findings could again represent congestive heart failure with mild pulmonary edema, but the possibility of pneumonia or pneumonitis in the lingula is also again not excluded. Electronically Signed   By: Esperanza Heiraymond  Rubner M.D.   On: 06/30/2015 21:22   Ct Cervical Spine Wo Contrast  06/30/2015  CLINICAL DATA:  Acute onset of syncope and fall. Found unresponsive. Neck pain. Initial encounter. EXAM: CT CERVICAL SPINE WITHOUT CONTRAST TECHNIQUE: Multidetector CT imaging of the cervical spine was performed without intravenous contrast. Multiplanar CT image reconstructions were also generated. COMPARISON:  CT of the head performed 05/27/2015 FINDINGS: There is no evidence of fracture or subluxation. Minimal degenerative change is noted at the lower cervical spine, with small anterior and posterior disc osteophyte complex is noted at C7-T1. Vertebral bodies demonstrate normal height and alignment. Intervertebral disc spaces are preserved. Prevertebral soft tissues are within normal  limits. There is incomplete fusion of the posterior arch of C1. The visualized portions of the thyroid gland are unremarkable in appearance. No significant soft tissue abnormalities are seen. The visualized portions of the brain are unremarkable in appearance. IMPRESSION: No evidence of fracture or subluxation along the cervical spine. Electronically Signed   By: Roanna RaiderJeffery  Chang M.D.   On: 06/30/2015 21:47   Assessment/Plan:  Syncope Given prodrome of symptoms unlikely to be cardiac.  No seizure like activity so unlikely seizures.  Evaluation by cardiology and neurology reassuring.  I think this may be psychogenic given his prodrome of symptoms including diaphoresis, SOB, and lightheadedness in the context of the traumatic recent event.   -advised to follow up with neurology and to keep scheduled appt -advised him that our clinic would call him in the AM to schedule an appt for f/u -reminded him not to take wellbutrin or tramadol as these can lower the seizure threshold -may need psychiatry consult    Marrian SalvageJacquelyn S Lilliane Sposito, MD 07/01/2015, 1:34 AM

## 2015-07-02 ENCOUNTER — Emergency Department (HOSPITAL_COMMUNITY): Payer: Self-pay

## 2015-07-02 ENCOUNTER — Emergency Department (HOSPITAL_COMMUNITY)
Admission: EM | Admit: 2015-07-02 | Discharge: 2015-07-02 | Discharge status: Against Medical Advice | Payer: Self-pay | Attending: Emergency Medicine | Admitting: Emergency Medicine

## 2015-07-02 ENCOUNTER — Inpatient Hospital Stay (HOSPITAL_COMMUNITY)
Admission: EM | Admit: 2015-07-02 | Discharge: 2015-07-03 | DRG: 310 | Discharge status: Against Medical Advice | Payer: Self-pay | Attending: Cardiology | Admitting: Cardiology

## 2015-07-02 ENCOUNTER — Encounter (HOSPITAL_COMMUNITY): Payer: Self-pay

## 2015-07-02 DIAGNOSIS — R55 Syncope and collapse: Secondary | ICD-10-CM | POA: Insufficient documentation

## 2015-07-02 DIAGNOSIS — Z8249 Family history of ischemic heart disease and other diseases of the circulatory system: Secondary | ICD-10-CM

## 2015-07-02 DIAGNOSIS — R0789 Other chest pain: Secondary | ICD-10-CM | POA: Insufficient documentation

## 2015-07-02 DIAGNOSIS — I472 Ventricular tachycardia, unspecified: Secondary | ICD-10-CM

## 2015-07-02 DIAGNOSIS — I4729 Other ventricular tachycardia: Secondary | ICD-10-CM

## 2015-07-02 DIAGNOSIS — I4510 Unspecified right bundle-branch block: Secondary | ICD-10-CM | POA: Diagnosis present

## 2015-07-02 DIAGNOSIS — R4020 Unspecified coma: Secondary | ICD-10-CM

## 2015-07-02 DIAGNOSIS — F17210 Nicotine dependence, cigarettes, uncomplicated: Secondary | ICD-10-CM | POA: Diagnosis present

## 2015-07-02 DIAGNOSIS — R51 Headache: Secondary | ICD-10-CM | POA: Insufficient documentation

## 2015-07-02 DIAGNOSIS — E663 Overweight: Secondary | ICD-10-CM | POA: Insufficient documentation

## 2015-07-02 DIAGNOSIS — R112 Nausea with vomiting, unspecified: Secondary | ICD-10-CM | POA: Insufficient documentation

## 2015-07-02 DIAGNOSIS — R42 Dizziness and giddiness: Secondary | ICD-10-CM | POA: Insufficient documentation

## 2015-07-02 LAB — CBC WITH DIFFERENTIAL/PLATELET
BASOS ABS: 0 10*3/uL (ref 0.0–0.1)
BASOS PCT: 0 %
EOS PCT: 2 %
Eosinophils Absolute: 0.2 10*3/uL (ref 0.0–0.7)
HEMATOCRIT: 37.4 % — AB (ref 39.0–52.0)
Hemoglobin: 12.8 g/dL — ABNORMAL LOW (ref 13.0–17.0)
Lymphocytes Relative: 27 %
Lymphs Abs: 2.1 10*3/uL (ref 0.7–4.0)
MCH: 31.2 pg (ref 26.0–34.0)
MCHC: 34.2 g/dL (ref 30.0–36.0)
MCV: 91.2 fL (ref 78.0–100.0)
MONO ABS: 0.3 10*3/uL (ref 0.1–1.0)
Monocytes Relative: 4 %
NEUTROS ABS: 5.2 10*3/uL (ref 1.7–7.7)
Neutrophils Relative %: 67 %
PLATELETS: 231 10*3/uL (ref 150–400)
RBC: 4.1 MIL/uL — ABNORMAL LOW (ref 4.22–5.81)
RDW: 13.8 % (ref 11.5–15.5)
WBC: 7.8 10*3/uL (ref 4.0–10.5)

## 2015-07-02 LAB — BASIC METABOLIC PANEL
ANION GAP: 8 (ref 5–15)
BUN: 25 mg/dL — ABNORMAL HIGH (ref 6–20)
CALCIUM: 8.8 mg/dL — AB (ref 8.9–10.3)
CO2: 25 mmol/L (ref 22–32)
Chloride: 104 mmol/L (ref 101–111)
Creatinine, Ser: 1.24 mg/dL (ref 0.61–1.24)
Glucose, Bld: 97 mg/dL (ref 65–99)
Potassium: 3.9 mmol/L (ref 3.5–5.1)
SODIUM: 137 mmol/L (ref 135–145)

## 2015-07-02 LAB — I-STAT TROPONIN, ED: TROPONIN I, POC: 0 ng/mL (ref 0.00–0.08)

## 2015-07-02 NOTE — ED Notes (Signed)
Pt refusing to go to xray. States "I'm tired of all this radiation." MD made aware.

## 2015-07-02 NOTE — ED Notes (Signed)
Pt to CT

## 2015-07-02 NOTE — ED Notes (Signed)
Per EMS: Pt seen here 2 days ago for chest pain. Pt began experiencing chest pain tonight, experienced some N/V, possible syncople episode. EMS states pt complaining of neckpain and headache. EMS gave 324 ASA and 1 nitro. Right sided chest pain with palpation. Pt states NO neck pain, only chest pain and headache. Does not remember falling, only remembers waking up on ground.

## 2015-07-02 NOTE — ED Notes (Signed)
Upon entering room pt found to not be in room, gown on bed, blood on floor, IV found in trashcan. EDP notified, charge RN aware

## 2015-07-02 NOTE — ED Notes (Signed)
Pt also refusing blood work at this time.

## 2015-07-02 NOTE — ED Notes (Signed)
Pt willing to go to xray at this time.

## 2015-07-02 NOTE — ED Provider Notes (Signed)
CSN: [id number]   Arrival date & time 07/02/15 0158  History  By signing my name below, I, Joseph Anthony, attest that this documentation has been prepared under the direction and in the presence of Shon Batonourtney F Horton, MD. Electronically Signed: Bethel BornBritney Anthony, ED Scribe. 07/02/2015. 2:51 AM.  Chief Complaint  Patient presents with  . Loss of Consciousness  . Chest Pain    HPI The history is provided by the patient. No language interpreter was used.   Joseph Anthony is a 44 y.o. male with history of seizures who presents to the Emergency Department complaining of a syncopal episode just PTA.  He was at work when he started to feel dizzy and develop a headache and nausea. After being dismissed from work due to his symptoms, he attempted to return to his hotel room and lost consciousness in the lobby. Pt next remembers being picked up off the floor and notes that his 8/10 headache and nausea have persisted.  Associated symptoms include 8/10 throbbing chest pain since 11:30 PM last night, SOB, and vomiting. Pt states that he has had several similar episodes recently noting that he was seen and evaluated on 06/26/2015 for syncope and returned to the ED 2 days later for the same. Patient was admitted on 06/28/2015 and discharged.  Patient has had multiple ER visits and a recent admission for the same. He was evaluated by both cardiology and neurology. He had a normal EEG, echo, EKG. Outpatient follow-up set up for neurology.   Past Medical History  Diagnosis Date  . 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  Marland Kitchen. Syncope     History reviewed. No pertinent past surgical history.  Family History  Problem Relation Age of Onset  . Heart attack Father     died from heart attack at age 43    Social History  Substance Use Topics  . Smoking status: Current Some Day Smoker -- 0.50 packs/day for 13 years    Types: Cigarettes  .  Smokeless tobacco: None  . Alcohol Use: No     Review of Systems  Constitutional: Negative for fever.  Respiratory: Positive for chest tightness. Negative for shortness of breath.   Cardiovascular: Positive for chest pain and syncope.  Gastrointestinal: Positive for nausea and vomiting. Negative for abdominal pain.  Neurological: Positive for dizziness and syncope. Negative for seizures.  All other systems reviewed and are negative.  Home Medications   Prior to Admission medications   Medication Sig Start Date End Date Taking? Authorizing Provider  acetaminophen (TYLENOL) 500 MG tablet Take 1 tablet (500 mg total) by mouth every 6 (six) hours as needed for moderate pain or headache. 06/30/15   Tasrif Ahmed, MD  bisacodyl (DULCOLAX) 5 MG EC tablet Take 1 tablet (5 mg total) by mouth daily as needed for moderate constipation. Patient not taking: Reported on 06/30/2015 06/30/15   Hyacinth Meekerasrif Ahmed, MD    Allergies  Review of patient's allergies indicates no known allergies.  Triage Vitals: BP 108/83 mmHg  Pulse 60  Resp 16  SpO2 91%  Physical Exam  Constitutional: He is oriented to person, place, and time. He appears well-developed and well-nourished.  Overweight, no acute distress  HENT:  Head: Normocephalic and atraumatic.  Eyes: EOM are normal. Pupils are equal, round, and reactive to light.  Pupils 2 mm reactive bilaterally  Cardiovascular: Normal rate, regular rhythm and normal heart sounds.  No murmur heard. Pulmonary/Chest: Effort normal and breath sounds normal. No respiratory distress. He has no wheezes. He exhibits no tenderness.  Abdominal: Soft. Bowel sounds are normal. There is no tenderness. There is no rebound.  Musculoskeletal: He exhibits no edema.  Neurological: He is alert and oriented to person, place, and time.  Cranial nerves II through XII intact, no dysmetria to finger-nose-finger, 5 out of 5 strength in all 4 extremities  Skin: Skin is warm and dry.   Psychiatric: He has a normal mood and affect.  Nursing note and vitals reviewed.   ED Course  Procedures   DIAGNOSTIC STUDIES: Oxygen Saturation is 96% on RA, normal by my interpretation.    COORDINATION OF CARE: 2:35 AM Discussed treatment plan which includes lab work, CXR, CT head, and EKG with pt at bedside and pt agreed to plan.  Labs Reviewed  CBC WITH DIFFERENTIAL/PLATELET - Abnormal; Notable for the following:    RBC 4.10 (*)    Hemoglobin 12.8 (*)    HCT 37.4 (*)    All other components within normal limits  BASIC METABOLIC PANEL - Abnormal; Notable for the following:    BUN 25 (*)    Calcium 8.8 (*)    All other components within normal limits  I-STAT TROPOININ, ED    Imaging Review Dg Chest 2 View  07/02/2015  CLINICAL DATA:  Acute onset of mid chest pain.  Initial encounter. EXAM: CHEST  2 VIEW COMPARISON:  Chest radiograph performed 06/30/2015 FINDINGS: The lungs are well-aerated. Minimal bilateral atelectasis is noted. There is no evidence of pleural effusion or pneumothorax. The heart is normal in size; the mediastinal contour is within normal limits. No acute osseous abnormalities are seen. IMPRESSION: Minimal bilateral atelectasis noted.  Lungs otherwise clear. Electronically Signed   By: Roanna Raider M.D.   On: 07/02/2015 03:16   Dg Chest 2 View  06/30/2015  CLINICAL DATA:  Syncope and shortness of breath today EXAM: CHEST  2 VIEW COMPARISON:  06/28/2015 FINDINGS: Hurt her mild cardiac enlargement similar to prior study. Mild interstitial prominence and vascular congestion also similar. Mildly more prominent left perihilar opacity when compared to prior study and when compared to the contralateral side. No pleural effusion. IMPRESSION: Findings could again represent congestive heart failure with mild pulmonary edema, but the possibility of pneumonia or pneumonitis in the lingula is also again not excluded. Electronically Signed   By: Esperanza Heir M.D.   On:  06/30/2015 21:22   Ct Head Wo Contrast  07/02/2015  CLINICAL DATA:  Syncope. EXAM: CT HEAD WITHOUT CONTRAST TECHNIQUE: Contiguous axial images were obtained from the base of the skull through the vertex without intravenous contrast. COMPARISON:  05/27/2015 FINDINGS: There is no intracranial hemorrhage, mass or evidence of acute infarction. There is no extra-axial fluid collection. Gray matter and white matter appear normal. Cerebral volume is normal for age. Brainstem and posterior fossa are unremarkable. The CSF spaces appear normal. The bony structures are intact. The visible portions of the paranasal sinuses are clear. IMPRESSION: Normal brain.  No acute findings. Electronically Signed   By: Ellery Plunk M.D.   On: 07/02/2015 04:20   Ct Cervical Spine Wo Contrast  06/30/2015  CLINICAL DATA:  Acute onset of syncope and fall. Found unresponsive. Neck pain. Initial encounter. EXAM: CT CERVICAL SPINE WITHOUT CONTRAST TECHNIQUE: Multidetector CT imaging of the cervical spine was performed without intravenous contrast. Multiplanar CT image reconstructions were also generated. COMPARISON:  CT of the head performed 05/27/2015 FINDINGS:  There is no evidence of fracture or subluxation. Minimal degenerative change is noted at the lower cervical spine, with small anterior and posterior disc osteophyte complex is noted at C7-T1. Vertebral bodies demonstrate normal height and alignment. Intervertebral disc spaces are preserved. Prevertebral soft tissues are within normal limits. There is incomplete fusion of the posterior arch of C1. The visualized portions of the thyroid gland are unremarkable in appearance. No significant soft tissue abnormalities are seen. The visualized portions of the brain are unremarkable in appearance. IMPRESSION: No evidence of fracture or subluxation along the cervical spine. Electronically Signed   By: Roanna Raider M.D.   On: 06/30/2015 21:47    I personally reviewed and evaluated  these images and lab results as a part of my medical decision-making.   EKG Interpretation  Date/Time:  Tuesday July 02 2015 02:18:13 EST Ventricular Rate:  85 PR Interval:  168 QRS Duration: 92 QT Interval:  356 QTC Calculation: 423 R Axis:   60 Text Interpretation:  Sinus rhythm Confirmed by HORTON  MD, COURTNEY ([id number]) on 07/02/2015 2:26:15 AM    MDM   Final diagnoses:  Loss of consciousness    Patient presents with multiple complaints including loss of consciousness, chest pain, headache. Nontoxic on exam. Nonfocal. Vital signs are reassuring. Has had a full evaluation and admission for similar symptoms with a normal EKG and echocardiogram. Events do not sound like seizures but patient does have a history of seizure. Basic labwork obtained and reassuring including troponin. EKG is nonischemic. CT head obtained and negative. Unfortunately the patient eloped prior to being able to discuss with him the results. Patient left AGAINST MEDICAL ADVICE.  I personally performed the services described in this documentation, which was scribed in my presence. The recorded information has been reviewed and is accurate.    Shon Baton, MD 07/02/15 [id number]

## 2015-07-02 NOTE — ED Notes (Signed)
Pt back from CT

## 2015-07-02 NOTE — ED Notes (Signed)
Pt placed in C-spine collar, pt immediately removed collar. RN informed patient that he needs to wear collar for own safety, pt continues to refuse collar.

## 2015-07-03 ENCOUNTER — Encounter (HOSPITAL_COMMUNITY): Payer: Self-pay | Admitting: Family Medicine

## 2015-07-03 DIAGNOSIS — I472 Ventricular tachycardia, unspecified: Secondary | ICD-10-CM

## 2015-07-03 DIAGNOSIS — I4729 Other ventricular tachycardia: Secondary | ICD-10-CM

## 2015-07-03 LAB — CBC
HEMATOCRIT: 39.7 % (ref 39.0–52.0)
Hemoglobin: 13.9 g/dL (ref 13.0–17.0)
MCH: 31.7 pg (ref 26.0–34.0)
MCHC: 35 g/dL (ref 30.0–36.0)
MCV: 90.6 fL (ref 78.0–100.0)
PLATELETS: 244 10*3/uL (ref 150–400)
RBC: 4.38 MIL/uL (ref 4.22–5.81)
RDW: 13.6 % (ref 11.5–15.5)
WBC: 7.4 10*3/uL (ref 4.0–10.5)

## 2015-07-03 LAB — BASIC METABOLIC PANEL
Anion gap: 8 (ref 5–15)
BUN: 31 mg/dL — AB (ref 6–20)
CHLORIDE: 106 mmol/L (ref 101–111)
CO2: 22 mmol/L (ref 22–32)
CREATININE: 1.21 mg/dL (ref 0.61–1.24)
Calcium: 8.6 mg/dL — ABNORMAL LOW (ref 8.9–10.3)
GFR calc Af Amer: >60 mL/min (ref >60)
GFR calc non Af Amer: >60 mL/min (ref >60)
GLUCOSE: 109 mg/dL — AB (ref 65–99)
POTASSIUM: 3.8 mmol/L (ref 3.5–5.1)
SODIUM: 136 mmol/L (ref 135–145)

## 2015-07-03 LAB — TROPONIN I

## 2015-07-03 LAB — CK: Total CK: 428 U/L — ABNORMAL HIGH (ref 49–397)

## 2015-07-03 LAB — URINALYSIS, ROUTINE W REFLEX MICROSCOPIC
BILIRUBIN URINE: NEGATIVE
GLUCOSE, UA: NEGATIVE mg/dL
HGB URINE DIPSTICK: NEGATIVE
KETONES UR: NEGATIVE mg/dL
LEUKOCYTES UA: NEGATIVE
Nitrite: NEGATIVE
PH: 6 (ref 5.0–8.0)
PROTEIN: NEGATIVE mg/dL
Specific Gravity, Urine: 1.025 (ref 1.005–1.030)

## 2015-07-03 LAB — RAPID URINE DRUG SCREEN, HOSP PERFORMED
Amphetamines: NOT DETECTED
Barbiturates: NOT DETECTED
Benzodiazepines: NOT DETECTED
Cocaine: NOT DETECTED
OPIATES: NOT DETECTED
Tetrahydrocannabinol: NOT DETECTED

## 2015-07-03 LAB — MAGNESIUM: MAGNESIUM: 1.9 mg/dL (ref 1.7–2.4)

## 2015-07-03 LAB — MRSA PCR SCREENING: MRSA by PCR: NEGATIVE

## 2015-07-03 MED ORDER — ACETAMINOPHEN 325 MG PO TABS: 650.0000 mg | ORAL_TABLET | ORAL | Status: DC | PRN

## 2015-07-03 MED ORDER — SODIUM CHLORIDE 0.9 % IV SOLN
1000.0000 mL | INTRAVENOUS | Status: DC
  Administered 2015-07-03: 1000 mL via INTRAVENOUS

## 2015-07-03 MED ORDER — ONDANSETRON HCL 4 MG/2ML IJ SOLN: 4.0000 mg | Freq: Four times a day (QID) | INTRAMUSCULAR | Status: DC | PRN

## 2015-07-03 MED ORDER — POTASSIUM CHLORIDE ER 10 MEQ PO TBCR
40.0000 meq | EXTENDED_RELEASE_TABLET | Freq: Once | ORAL | Status: DC
  Filled 2015-07-03: qty 4

## 2015-07-03 MED ORDER — METOPROLOL TARTRATE 25 MG PO TABS: 25.0000 mg | ORAL_TABLET | Freq: Two times a day (BID) | ORAL | Status: DC

## 2015-07-03 MED ORDER — ONDANSETRON HCL 4 MG/2ML IJ SOLN
4.0000 mg | Freq: Once | INTRAMUSCULAR | Status: DC
  Filled 2015-07-03: qty 2

## 2015-07-03 MED ORDER — SODIUM CHLORIDE 0.9 % IV SOLN
1000.0000 mL | Freq: Once | INTRAVENOUS | Status: AC
  Administered 2015-07-03: 1000 mL via INTRAVENOUS

## 2015-07-03 MED ORDER — MAGNESIUM SULFATE 2 GM/50ML IV SOLN
2.0000 g | Freq: Once | INTRAVENOUS | Status: AC
  Administered 2015-07-03: 2 g via INTRAVENOUS
  Filled 2015-07-03: qty 50

## 2015-07-03 MED ORDER — POTASSIUM CHLORIDE 10 MEQ/100ML IV SOLN
10.0000 meq | INTRAVENOUS | Status: DC
  Administered 2015-07-03: 10 meq via INTRAVENOUS
  Filled 2015-07-03: qty 100

## 2015-07-03 NOTE — Significant Event (Signed)
Paged by RN re: patient stating he wanted to leave the hospital AMA.   Patient became insistent upon leaving to speak with his mother. He could not indicate why communication was urgent. RN had tried to get patient in touch with his mother via phone but all of the numbers he provided did not work.  He became increasingly irritated and indicated he wanted to leave.   During the admission H&P, we had discussed the gravity of his situation, specifically that VT is a life-threatening disorder that may be associated with coronary artery disease, which is what had claimed his father's life when his father was in his 30s.   After indicating his desire to leave AMA, I reviewed with him that VT is a life threatening disorder that can lead to sudden death or even if resuscitated, brain damage and severe debilitation. I urged him to stay for himself, his 3 children, and parents. He briefly suggested he would stay if he could pray. The care RN attempted to make all reasonable accommodations (he was requesting to go to a chapel) but Joseph Anthony eventually left AMA. Joseph Anthony was competent to make his own decisions, but unfortunately has very poor judgement.

## 2015-07-03 NOTE — Progress Notes (Signed)
@  [id number] pt bed alarm went off, went into room to find patient halfway out of bed, taking off monitor and trying to pull out IV, patient said he needed to go to the bathroom so RN said he can go with the monitor, patient refused, RN walked patient safely to the bathroom, Patient then got very agitated and said he was leaving, RN informed on-call cards fellow, Dr. Zachery ConchFriedman, attempted to call multiple family members per patient request, all numbers incorrect or out of service, patient continued to be agitated stating he was leaving and his parents were doctors and [place], RN took out peripheral IV, patient refused to let RN apply pressure for even a minute, Three nurses and charge nurse were in the room at this time, Leanord Hawkingara Megan Melvin and Lexi, all nurses informed risks of leaving AMA, charge nurse Alinda MoneyMelvin got AMA paperwork, patient refused to sign, all four nurses signed AMA paperwork, Dr. Zachery ConchFriedman at bedside, patient stated he was muslim and requested to be able to pray, RN asked patient how we could help him with that and if we could provide a mat, etc. Patient refused to answer and stated he just needed to get home and he was leaving, RN again informed patient risks leaving AMA, patient still refused and got all his belongings and left stating he was going to the airport. AMA paperwork in the chart.

## 2015-07-03 NOTE — Progress Notes (Signed)
Pt noncompliant with taking pills, called cards fellow Zachery ConchFriedman, orders to change potassium to IV.

## 2015-07-03 NOTE — ED Provider Notes (Addendum)
CSN: [id number]     Arrival date & time 07/02/15  2345 History  By signing my name below, I, Elon Spanner, attest that this documentation has been prepared under the direction and in the presence of Dione Booze, MD. Electronically Signed: Elon Spanner, ED Scribe. 07/03/2015. 1:29 AM.    Chief Complaint  Patient presents with  . Palpitations  . Loss of Consciousness   Patient is a 44 y.o. male presenting with syncope. The history is provided by the patient and medical records. No language interpreter was used.  Loss of Consciousness Associated symptoms: palpitations    HPI Comments: Joseph Anthony is a 44 y.o. male who presents to the Emergency Department complaining of LOC that occurred yesterday at 2:15 PM with a presyncopal sensation of headache and dizziness.  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  He notes current associated CP, nausea, vomiting, double vision, bilateral shoulder pain, and upper back pain.  He has treated himself with aspirin and rest.  Over the past week, the patient has had multiple similar episodes and multiple ED visits with unremarkable workup.  The patient was admitted 11/25-11/27 and discharged with instructions to f/u with neurologist (Currently scheduled for 12/17).   Past Medical History  Diagnosis Date  . 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  Marland Kitchen Syncope    History reviewed. No pertinent past surgical history. Family History  Problem Relation Age of Onset  . Heart attack Father     died from heart attack at age 45   Social History  Substance Use Topics  . Smoking status: Current Some Day Smoker -- 0.50 packs/day for 13 years    Types: Cigarettes  . Smokeless tobacco: None  . Alcohol Use: No    Review of Systems  Cardiovascular: Positive for palpitations and syncope.   10 Systems reviewed and all are  negative for acute change except as noted in the HPI.  Allergies  Review of patient's allergies indicates no known allergies.  Home Medications   Prior to Admission medications   Medication Sig Start Date End Date Taking? Authorizing Provider  acetaminophen (TYLENOL) 500 MG tablet Take 1 tablet (500 mg total) by mouth every 6 (six) hours as needed for moderate pain or headache. 06/30/15  Yes Tasrif Ahmed, MD  bisacodyl (DULCOLAX) 5 MG EC tablet Take 1 tablet (5 mg total) by mouth daily as needed for moderate constipation. Patient not taking: Reported on 06/30/2015 06/30/15   Tasrif Ahmed, MD   BP 114/76 mmHg  Pulse 75  Temp(Src) 97.8 F (36.6 C) (Oral)  Resp 20  Ht  (1.702 m)  Wt 239 lb (108.41 kg)  BMI 37.42 kg/m2  SpO2 94% Physical Exam  Constitutional: He is oriented to person, place, and time. He appears well-developed and well-nourished. No distress.  HENT:  Head: Normocephalic and atraumatic.  Eyes: Conjunctivae and EOM are normal.  Neck: Neck supple. No tracheal deviation present.  Cardiovascular: Normal rate.   Pulmonary/Chest: Effort normal. No respiratory distress.  Musculoskeletal: Normal range of motion.  Neurological: He is alert and oriented to person, place, and time.  Skin: Skin is warm and dry.  Psychiatric: He has a normal mood and affect. His behavior is normal.  Nursing note and vitals reviewed.   ED Course  Procedures (including critical care time)  DIAGNOSTIC STUDIES: Oxygen Saturation is  94% on RA, normal by my interpretation.    COORDINATION OF CARE:  1:19 AM Will order additional labs and admit patient.  Patient acknowledges and agrees with plan.    Labs Review Results for orders placed or performed during the hospital encounter of 07/02/15  Basic metabolic panel  Result Value Ref Range   Sodium 136 135 - 145 mmol/L   Potassium 3.8 3.5 - 5.1 mmol/L   Chloride 106 101 - 111 mmol/L   CO2 22 22 - 32 mmol/L   Glucose, Bld 109 (H) 65 -  99 mg/dL   BUN 31 (H) 6 - 20 mg/dL   Creatinine, Ser 6.96 0.61 - 1.24 mg/dL   Calcium 8.6 (L) 8.9 - 10.3 mg/dL   GFR calc non Af Amer >60 >60 mL/min   GFR calc Af Amer >60 >60 mL/min   Anion gap 8 5 - 15  CBC  Result Value Ref Range   WBC 7.4 4.0 - 10.5 K/uL   RBC 4.38 4.22 - 5.81 MIL/uL   Hemoglobin 13.9 13.0 - 17.0 g/dL   HCT 29.5 28.4 - 13.2 %   MCV 90.6 78.0 - 100.0 fL   MCH 31.7 26.0 - 34.0 pg   MCHC 35.0 30.0 - 36.0 g/dL   RDW 44.0 10.2 - 72.5 %   Platelets 244 150 - 400 K/uL  Urinalysis, Routine w reflex microscopic (not at [facility])  Result Value Ref Range   Color, Urine YELLOW YELLOW   APPearance CLEAR CLEAR   Specific Gravity, Urine 1.025 1.005 - 1.030   pH 6.0 5.0 - 8.0   Glucose, UA NEGATIVE NEGATIVE mg/dL   Hgb urine dipstick NEGATIVE NEGATIVE   Bilirubin Urine NEGATIVE NEGATIVE   Ketones, ur NEGATIVE NEGATIVE mg/dL   Protein, ur NEGATIVE NEGATIVE mg/dL   Nitrite NEGATIVE NEGATIVE   Leukocytes, UA NEGATIVE NEGATIVE   I have personally reviewed and evaluated these lab results as part of my medical decision-making.   EKG Interpretation   Date/Time:  Tuesday July 02 2015 23:55:12 EST Ventricular Rate:  82 PR Interval:  155 QRS Duration: 101 QT Interval:  366 QTC Calculation: 427 R Axis:   45 Text Interpretation:  Sinus rhythm Low voltage, precordial leads No  significant change since last tracing earlier today Confirmed by [facility]   MD, Tyneka Scafidi ([id number]) on 07/03/2015 1:01:10 AM      CRITICAL CARE Performed by: IHKVQ,QVZDG Total critical care time: 35 minutes Critical care time was exclusive of separately billable procedures and treating other patients. Critical care was necessary to treat or prevent imminent or life-threatening deterioration. Critical care was time spent personally by me on the following activities: development of treatment plan with patient and/or surrogate as well as nursing, discussions with consultants, evaluation of patient's response  to treatment, examination of patient, obtaining history from patient or surrogate, ordering and performing treatments and interventions, ordering and review of laboratory studies, ordering and review of radiographic studies, pulse oximetry and re-evaluation of patient's condition. MDM   Final diagnoses:  Syncope and collapse  Ventricular tachycardia (paroxysmal) (HCC)    Patient with syncopal episode. Review of old records shows that he has multiple ED visits on a recent hospitalization for evaluation of syncope. Hospitalization was on November 25 but he is also had ED visits for syncope on October 24, November 23, November 27, November 29. Laboratory workup does show an elevation of his BUN and I was concerned that he may have been dehydrated. Also, he was having urinary incontinence which  is worrisome for possible seizure. It is noted that he did have a normal EEG while in the hospital. However, while talking with the patient, he was noted to have an extended relative ventricular tachycardia. The monitor shows this run lasted for 32 beats and patient did state that he was symptomatic with this. This had not been documented and his hospitalization or any of his prior ED visits. He clearly needs to be admitted now to get started on appropriate antiarrhythmic therapy. Case is discussed with Dr. Delane GingerGill of internal medicine teaching service who agrees except the patient in [facility].  I personally performed the services described in this documentation, which was scribed in my presence. The recorded information has been reviewed and is accurate.      Dione Boozeavid Tannah Dreyfuss, MD 07/03/15 0148  Dr. Delane GingerGill has discussed case with cardiology agrees to accept the patient on their service. Accepting cardiologist is Dr. Zachery ConchFriedman.  Dione Boozeavid Ilyssa Grennan, MD 07/03/15 [phone]

## 2015-07-03 NOTE — ED Notes (Signed)
[facility] called for report. [facility] called for transport. Confirmed with Dr. Preston FleetingGlick that Wyline MoodBranch is the attending MD.

## 2015-07-03 NOTE — ED Notes (Signed)
Pt states he started experiencing heart palpitations today at 14:00 and had a witnessed syncopal episode. Pt reports he was seen yesterday [facility] same [facility].

## 2015-07-03 NOTE — ED Notes (Signed)
Pt with 32 bts of Vtach. MD at bedside.

## 2015-07-03 NOTE — H&P (Addendum)
Patient ID: Joseph Anthony MRN: [id number], DOB/AGE: [birth year]   Admit date: 07/02/2015   Primary Physician: No PCP Per Patient Primary Cardiologist: Dina Rich  Pt. Profile:  44 y.o.male history of childhood seizures, tobacco abuse, and recurrent syncope of unclear etiology who represents to the ER with syncope and is found to have a 32 run of MMVT with CL.  Problem List  Past Medical History  Diagnosis Date  . 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  Marland Kitchen Syncope     History reviewed. No pertinent past surgical history.   Allergies  No Known Allergies  HPI  44 y.o.male history of childhood seizures, tobacco abuse, and recurrent syncope of unclear etiology who represents to the ER with syncope and is found to have a 32 run of MMVT with CL.  Joseph Anthony was recently admitted to [facility] from 11/25-11/27 for syncope. He was seen by the cardiology service. He underwent a TTE demonstrating LVEF 55-60%, no WMAs, normal diastolic function. He was monitored on tele and it demonstrated "some sinus brady to high 40s, short runs of atach, but otherwise no signicant arrhythmias." He underwent EEG which was normal. Given his excellent functional capacity (walks/jogs up to 2 miles daily without any troubles, works as an Personnel officer), he was discharged with plans for an event monitor and outpatient work-up for possible seizure disorder.  He was subsequently seen again in the ER on 11/27 for syncope and then again on 11/29 for chest pain, nausea, vomiting, and syncope. He presented again today for yet another episode of syncope. He states that he was doing some electrical work and developed a sensation of sizziness, weakness, with associationed chest pain and palpitations. He then had a syncopal episode and estimates he was out for 1 minute. This was typical of previous episodes. He estimates he has had 4-5 of  these episodes over the past week.   He presented to the [facility] ED for evaluation. On arrival, he was hemodynamically stable saturating 99% on RA. ECG demonstrated NSR TW flattening in aVL. Compared to 07/02/15 ECG (2:18am), TW flattening in aVL is new and the repol pattern in inferior leads is slightly different. Labs were notable for K 3.8, Cr 1.21, Mg 1.9, TnI < 0.03. He abruptly developed a 32 beat run of MMVT with a CL and RBBB like superior axis (see Media Tab). He reported that during that episode, he felt a "burning sensation on the inside of me" and developed pre-syncopal symptoms but did not actually faint.   He reports a father who died from an MI at age 48. 2 sisters and 1 brother are healthy. No alcohol or ilicits. Smokes 5 cigarettes per day and has done this for 10 years. Has 3 kids, never married. Works as an Personnel officer.   Home Medications  Prior to Admission medications   Medication Sig Start Date End Date Taking? Authorizing Provider  acetaminophen (TYLENOL) 500 MG tablet Take 1 tablet (500 mg total) by mouth every 6 (six) hours as needed for moderate pain or headache. 06/30/15  Yes Tasrif Ahmed, MD  bisacodyl (DULCOLAX) 5 MG EC tablet Take 1 tablet (5 mg total) by mouth daily as needed for moderate constipation. Patient not taking: Reported on 06/30/2015 06/30/15   Hyacinth Meeker, MD    Family History  Family History  Problem Relation Age of Onset  . Heart attack Father  died from heart attack at age 53    Social History  Social History   Social History  . Marital Status: Single    Spouse Name: N/A  . Number of Children: N/A  . Years of Education: N/A   Occupational History  . Holiday representative    Social History Main Topics  . Smoking status: Current Some Day Smoker -- 0.50 packs/day for 13 years    Types: Cigarettes  . Smokeless tobacco: Not on file  . Alcohol Use: No  . Drug Use: No  . Sexual Activity: Not on file   Other Topics Concern  . Not on file     Social History Narrative     Review of Systems General:  No chills, fever, night sweats or weight changes.  Cardiovascular:  See HPI Dermatological: No rash, lesions/masses Respiratory: No cough, dyspnea Urologic: No hematuria, dysuria Abdominal:   No nausea, vomiting, diarrhea, bright red blood per rectum, melena, or hematemesis Neurologic:  No visual changes, wkns, changes in mental status. All other systems reviewed and are otherwise negative except as noted above.  Physical Exam  Blood pressure 104/59, pulse 61, temperature 97.8 F (36.6 C), temperature source Oral, resp. rate 14, height 5\' 7"  (1.702 m), weight 108.41 kg (239 lb), SpO2 99 %.  General: Pleasant, NAD, sleeping Psych: Normal affect. Neuro: Alert and oriented X 3. Moves all extremities spontaneously. HEENT: Normal  Neck: Supple without bruits or JVD. Lungs:  Resp regular and unlabored, CTA. Heart: RRR no s3, s4, or murmurs. Abdomen: Soft, non-tender, non-distended, BS + x 4.  Extremities: No clubbing, cyanosis or edema. DP/PT/Radials 2+ and equal bilaterally.  Labs  Troponin [facility] Test)  Recent Labs  07/02/15 0325  TROPIPOC 0.00    Recent Labs  07/03/15 0127  CKTOTAL 428*  TROPONINI <0.03   Lab Results  Component Value Date   WBC 7.4 07/03/2015   HGB 13.9 07/03/2015   HCT 39.7 07/03/2015   MCV 90.6 07/03/2015   PLT 244 07/03/2015    Recent Labs Lab 06/29/15 0320  07/03/15 0021  NA 137  < > 136  K 3.8  < > 3.8  CL 105  < > 106  CO2 26  < > 22  BUN 16  < > 31*  CREATININE 1.18  < > 1.21  CALCIUM 8.5*  < > 8.6*  PROT 6.3*  --   --   BILITOT 0.6  --   --   ALKPHOS 76  --   --   ALT 14*  --   --   AST 19  --   --   GLUCOSE 83  < > 109*  < > = values in this interval not displayed. Lab Results  Component Value Date   CHOL 145 06/28/2015   HDL 42 06/28/2015   LDLCALC 92 06/28/2015   TRIG 57 06/28/2015   No results found for: DDIMER   Radiology/Studies  Dg Chest 1  View  06/28/2015  CLINICAL DATA:  Syncope. EXAM: CHEST 1 VIEW COMPARISON:  05/27/2015. FINDINGS: Mediastinum and hilar structures normal. Borderline cardiomegaly. Borderline pulmonary venous congestion. Bilateral pulmonary interstitial prominence. A mild component congestive heart failure cannot be excluded. Pneumonitis cannot be excluded. Low lung volumes. No pleural effusion or pneumothorax. IMPRESSION: 1. Borderline cardiomegaly and pulmonary venous congestion with mild pulmonary interstitial prominence. Mild component congestive heart failure cannot be excluded. Mild pneumonitis cannot be excluded. 2. Low lung volumes. Electronically Signed   By: Maisie Fus  Register  On: 06/28/2015 07:54   Dg Chest 2 View  07/02/2015  CLINICAL DATA:  Acute onset of mid chest pain.  Initial encounter. EXAM: CHEST  2 VIEW COMPARISON:  Chest radiograph performed 06/30/2015 FINDINGS: The lungs are well-aerated. Minimal bilateral atelectasis is noted. There is no evidence of pleural effusion or pneumothorax. The heart is normal in size; the mediastinal contour is within normal limits. No acute osseous abnormalities are seen. IMPRESSION: Minimal bilateral atelectasis noted.  Lungs otherwise clear. Electronically Signed   By: Roanna Raider M.D.   On: 07/02/2015 03:16   Dg Chest 2 View  06/30/2015  CLINICAL DATA:  Syncope and shortness of breath today EXAM: CHEST  2 VIEW COMPARISON:  06/28/2015 FINDINGS: Hurt her mild cardiac enlargement similar to prior study. Mild interstitial prominence and vascular congestion also similar. Mildly more prominent left perihilar opacity when compared to prior study and when compared to the contralateral side. No pleural effusion. IMPRESSION: Findings could again represent congestive heart failure with mild pulmonary edema, but the possibility of pneumonia or pneumonitis in the lingula is also again not excluded. Electronically Signed   By: Esperanza Heir M.D.   On: 06/30/2015 21:22   Ct  Head Wo Contrast  07/02/2015  CLINICAL DATA:  Syncope. EXAM: CT HEAD WITHOUT CONTRAST TECHNIQUE: Contiguous axial images were obtained from the base of the skull through the vertex without intravenous contrast. COMPARISON:  05/27/2015 FINDINGS: There is no intracranial hemorrhage, mass or evidence of acute infarction. There is no extra-axial fluid collection. Gray matter and white matter appear normal. Cerebral volume is normal for age. Brainstem and posterior fossa are unremarkable. The CSF spaces appear normal. The bony structures are intact. The visible portions of the paranasal sinuses are clear. IMPRESSION: Normal brain.  No acute findings. Electronically Signed   By: Ellery Plunk M.D.   On: 07/02/2015 04:20   Ct Angio Chest Pe W/cm &/or Wo Cm  06/28/2015  CLINICAL DATA:  Chest pain, shortness of breath and dizziness for the last 3 days. Syncopal episode this morning. EXAM: CT ANGIOGRAPHY CHEST WITH CONTRAST TECHNIQUE: Multidetector CT imaging of the chest was performed using the standard protocol during bolus administration of intravenous contrast. Multiplanar CT image reconstructions and MIPs were obtained to evaluate the vascular anatomy. CONTRAST:  80mL OMNIPAQUE IOHEXOL 350 MG/ML SOLN COMPARISON:  Chest radiograph 06/28/2015 FINDINGS: There is no evidence of pulmonary embolus. There is a small hiatal hernia. There is no evidence of focal lung parenchymal consolidation, pleural effusion or pneumothorax. There is mild pulmonary edema. There is a 4 mm right middle lobe subpleural perifissural nodule pulmonary. There is a mildly prominent right peribronchial lymph node, nonspecific. The heart is normal in size. Great vessels are normal in caliber. No axillary lymphadenopathy is seen. The visualized portions of the thyroid gland are unremarkable in appearance. The visualized portions of the liver and spleen are unremarkable. The visualized portions of the pancreas, gallbladder, stomach, adrenal  glands and kidneys are within normal limits. No acute osseous abnormalities are seen. 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 Review of the MIP images confirms the above findings. IMPRESSION: No evidence of pulmonary embolus, aortic dissection or other significant vascular abnormality. Mild interstitial pulmonary edema. 4 mm right lower lobe subpleural pulmonary nodule. If the patient is at high risk for bronchogenic carcinoma, follow-up chest CT at 1 year is recommended. If the patient is at low risk, no follow-up is needed. This recommendation follows the consensus statement: Guidelines for  Management of Small Pulmonary Nodules Detected on CT Scans: A Statement from the Fleischner Society as published in Radiology 2005; 237:395-400. Small hiatal hernia. Electronically Signed   By: Ted Mcalpineobrinka  Dimitrova M.D.   On: 06/28/2015 09:46   Ct Cervical Spine Wo Contrast  06/30/2015  CLINICAL DATA:  Acute onset of syncope and fall. Found unresponsive. Neck pain. Initial encounter. EXAM: CT CERVICAL SPINE WITHOUT CONTRAST TECHNIQUE: Multidetector CT imaging of the cervical spine was performed without intravenous contrast. Multiplanar CT image reconstructions were also generated. COMPARISON:  CT of the head performed 05/27/2015 FINDINGS: There is no evidence of fracture or subluxation. Minimal degenerative change is noted at the lower cervical spine, with small anterior and posterior disc osteophyte complex is noted at C7-T1. Vertebral bodies demonstrate normal height and alignment. Intervertebral disc spaces are preserved. Prevertebral soft tissues are within normal limits. There is incomplete fusion of the posterior arch of C1. The visualized portions of the thyroid gland are unremarkable in appearance. No significant soft tissue abnormalities are seen. The visualized portions of the brain are unremarkable in appearance. IMPRESSION: No evidence of fracture or subluxation along  the cervical spine. Electronically Signed   By: Roanna RaiderJeffery  Chang M.D.   On: 06/30/2015 21:47    ECG  NSR TW flattening in aVL. Compared to 07/02/15 ECG (2:18am), TW flattening in aVL is new and the repol pattern in inferior leads is slightly different.   ASSESSMENT AND PLAN  44 y.o.male history of childhood seizures, tobacco abuse, and recurrent syncope of unclear etiology who represents to the ER with syncope and is found to have a 32 run of MMVT with 320ms CL. QRS morphology is atypical RBBB with superior axis, which is not typical of most benign "normal heart" VTs. Given the recent episodes of CP, strong FH of early MI, and ongoing tobacco use, it is possible that this is ischemic VT.   - Metoprolol 25mg  BID - lidocaine gtt if recurrent runs of VT; will avoid amio in the event that ablation is pursued - K>4, Mg>2 - NPO for cardiac cath - Consider MRI, particularly of CAD is not found on cath - EP consult for ICD  Signed, Glori LuisFRIEDMAN, Kashawn Manzano, MD 07/03/2015, 3:38 AM

## 2016-11-14 IMAGING — CR DG CHEST 2V
2 series · 2 of 2 positions shown · non-contrast
Comparison: 06/28/2015

CLINICAL DATA: Syncope and shortness of breath today

EXAM:
CHEST  2 VIEW

[chest lat]
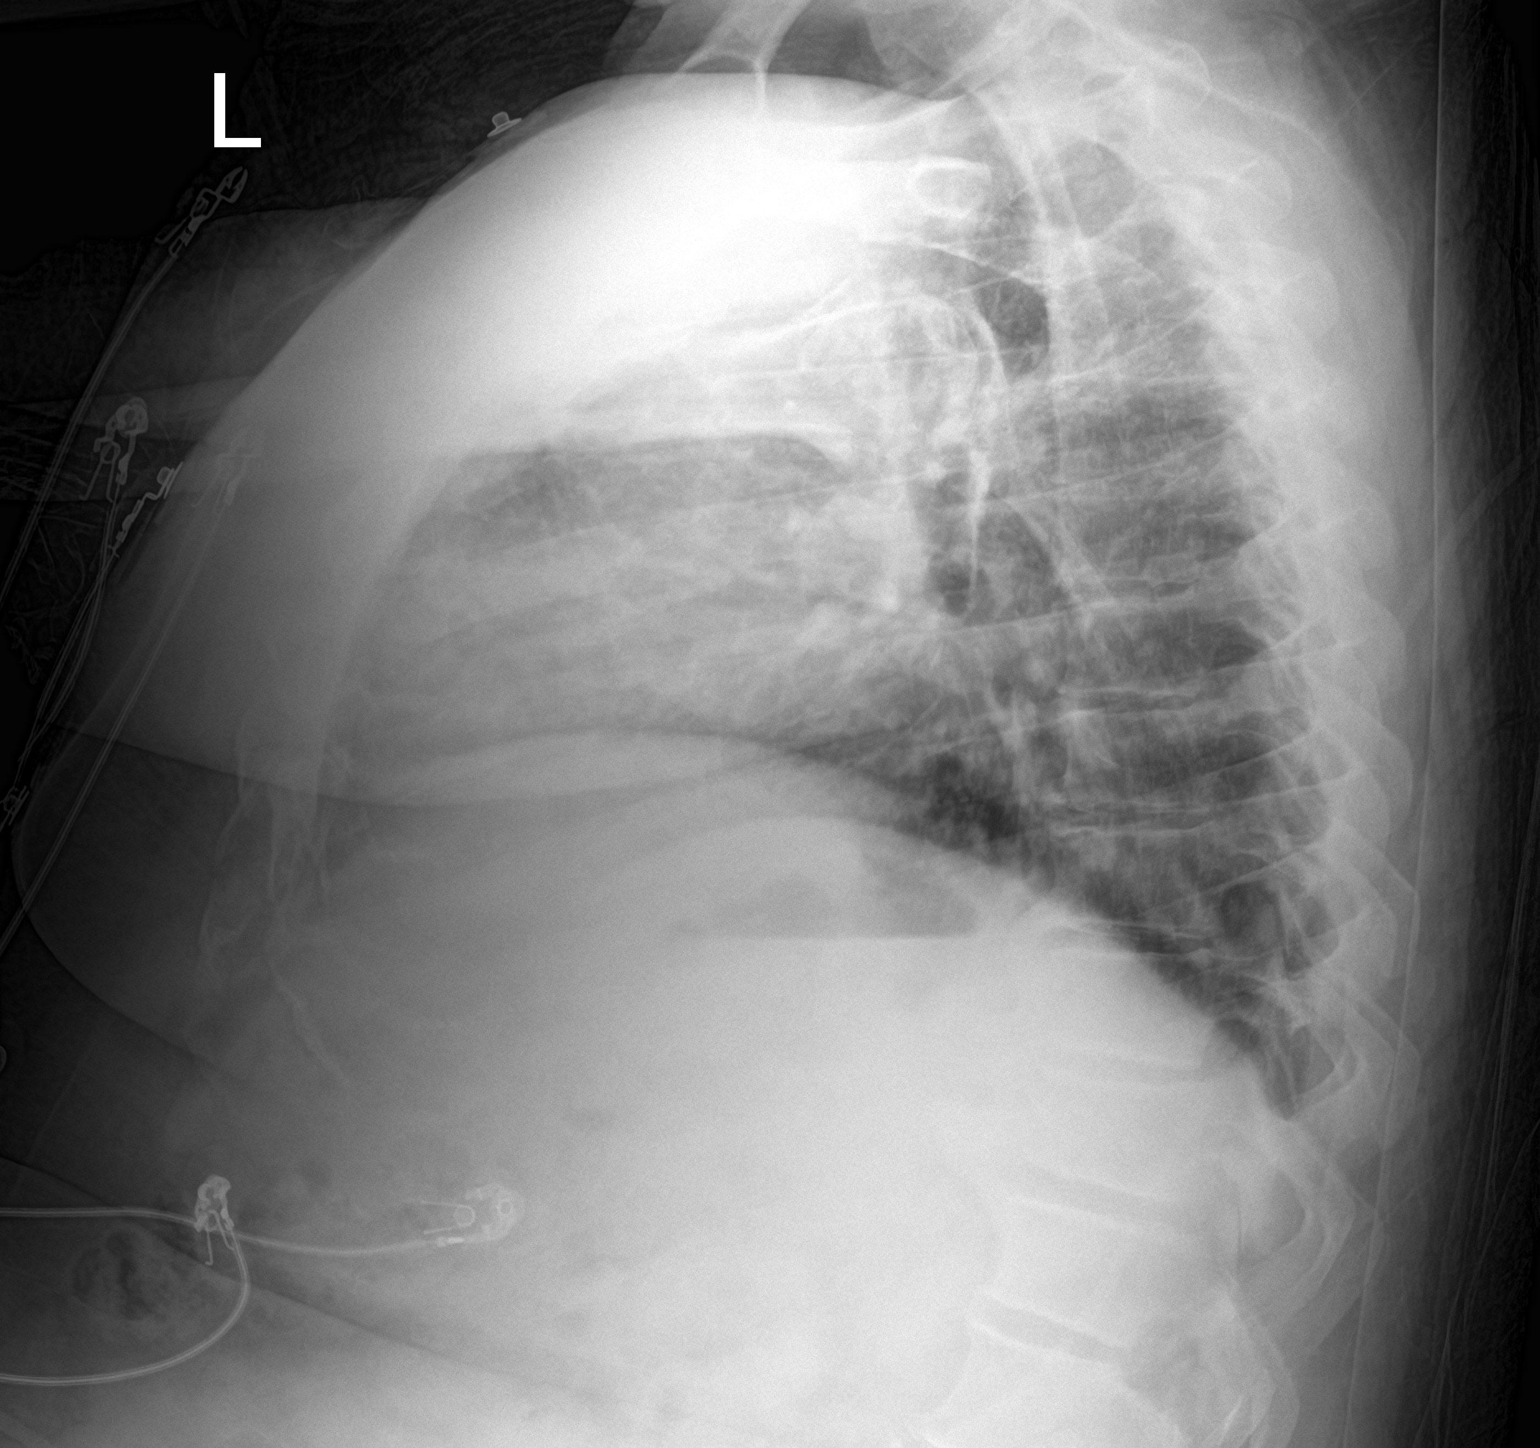

[chest ap]
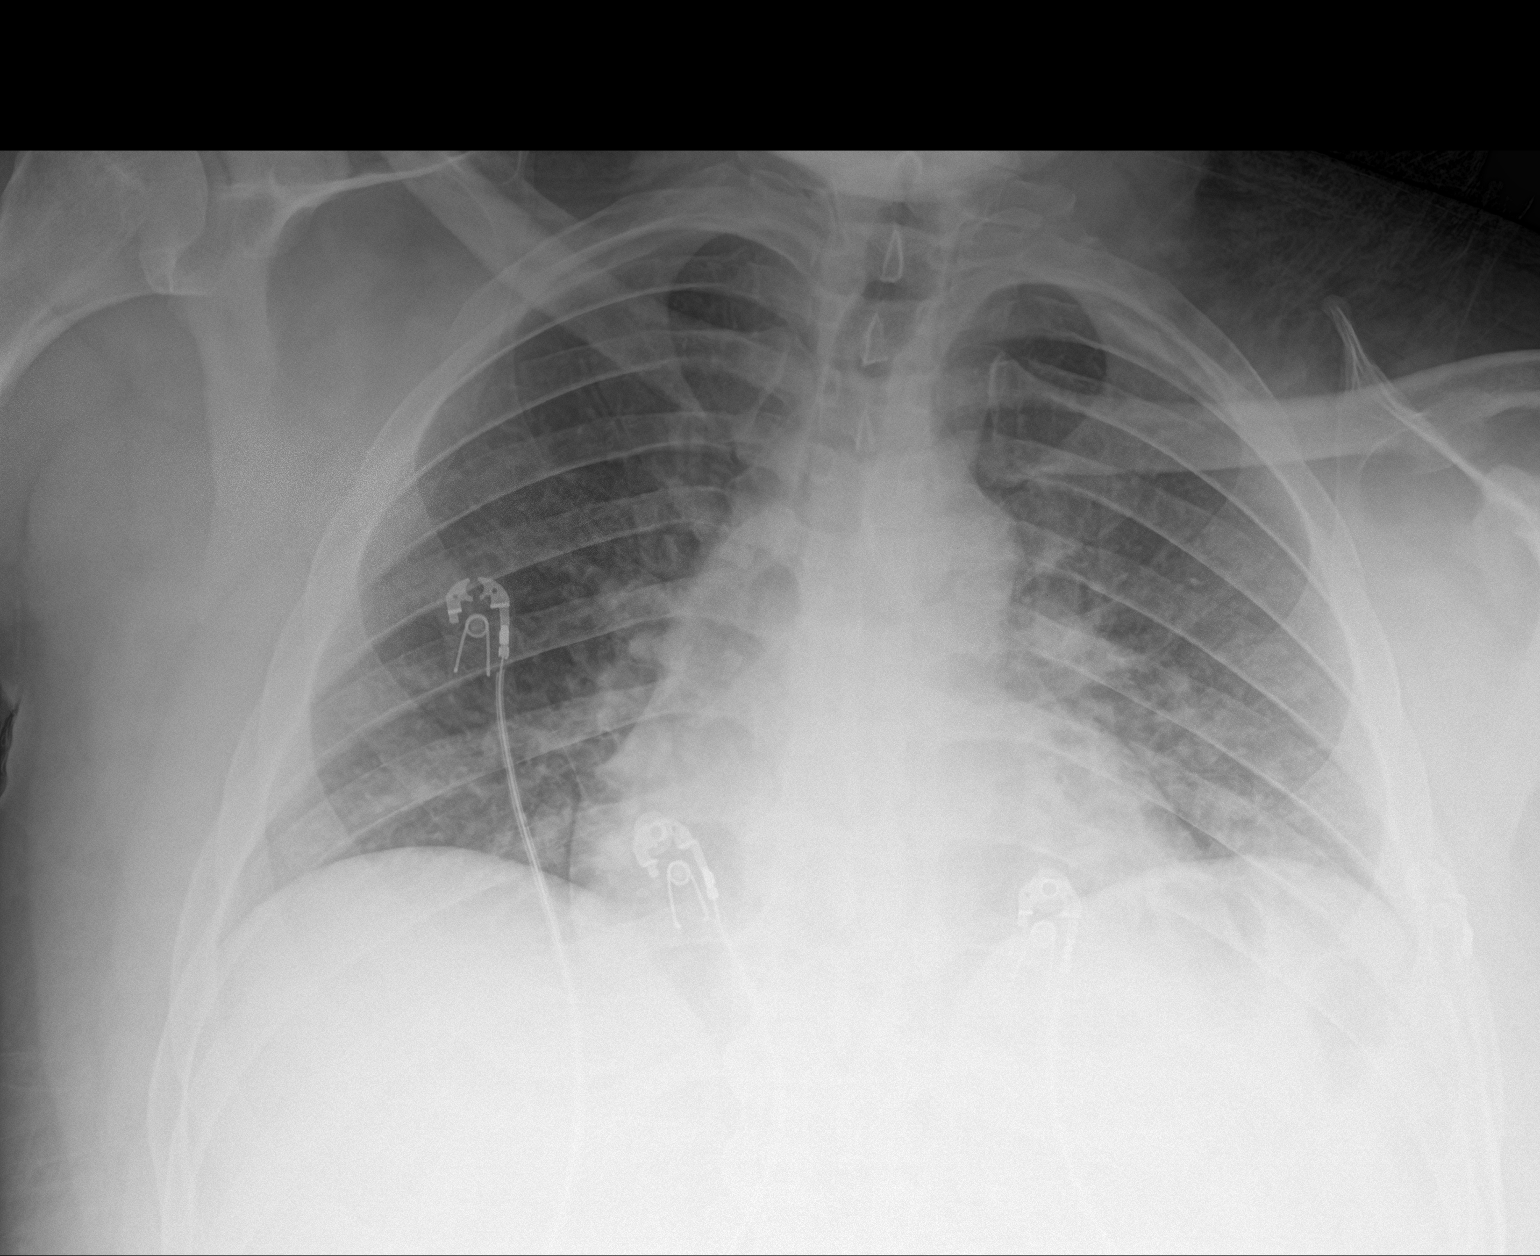

[2 of 2 positions shown; findings below may reference images not displayed]

FINDINGS: Hurt her mild cardiac enlargement similar to prior study. Mild
interstitial prominence and vascular congestion also similar. Mildly
more prominent left perihilar opacity when compared to prior study
and when compared to the contralateral side. No pleural effusion.
IMPRESSION: Findings could again represent congestive heart failure with mild
pulmonary edema, but the possibility of pneumonia or pneumonitis in
the lingula is also again not excluded.

## 2016-11-16 IMAGING — CR DG CHEST 2V
2 series · 2 of 2 positions shown · non-contrast
Comparison: Chest radiograph performed 06/30/2015

CLINICAL DATA: Acute onset of mid chest pain.  Initial encounter.

EXAM:
CHEST  2 VIEW

[chest pa]
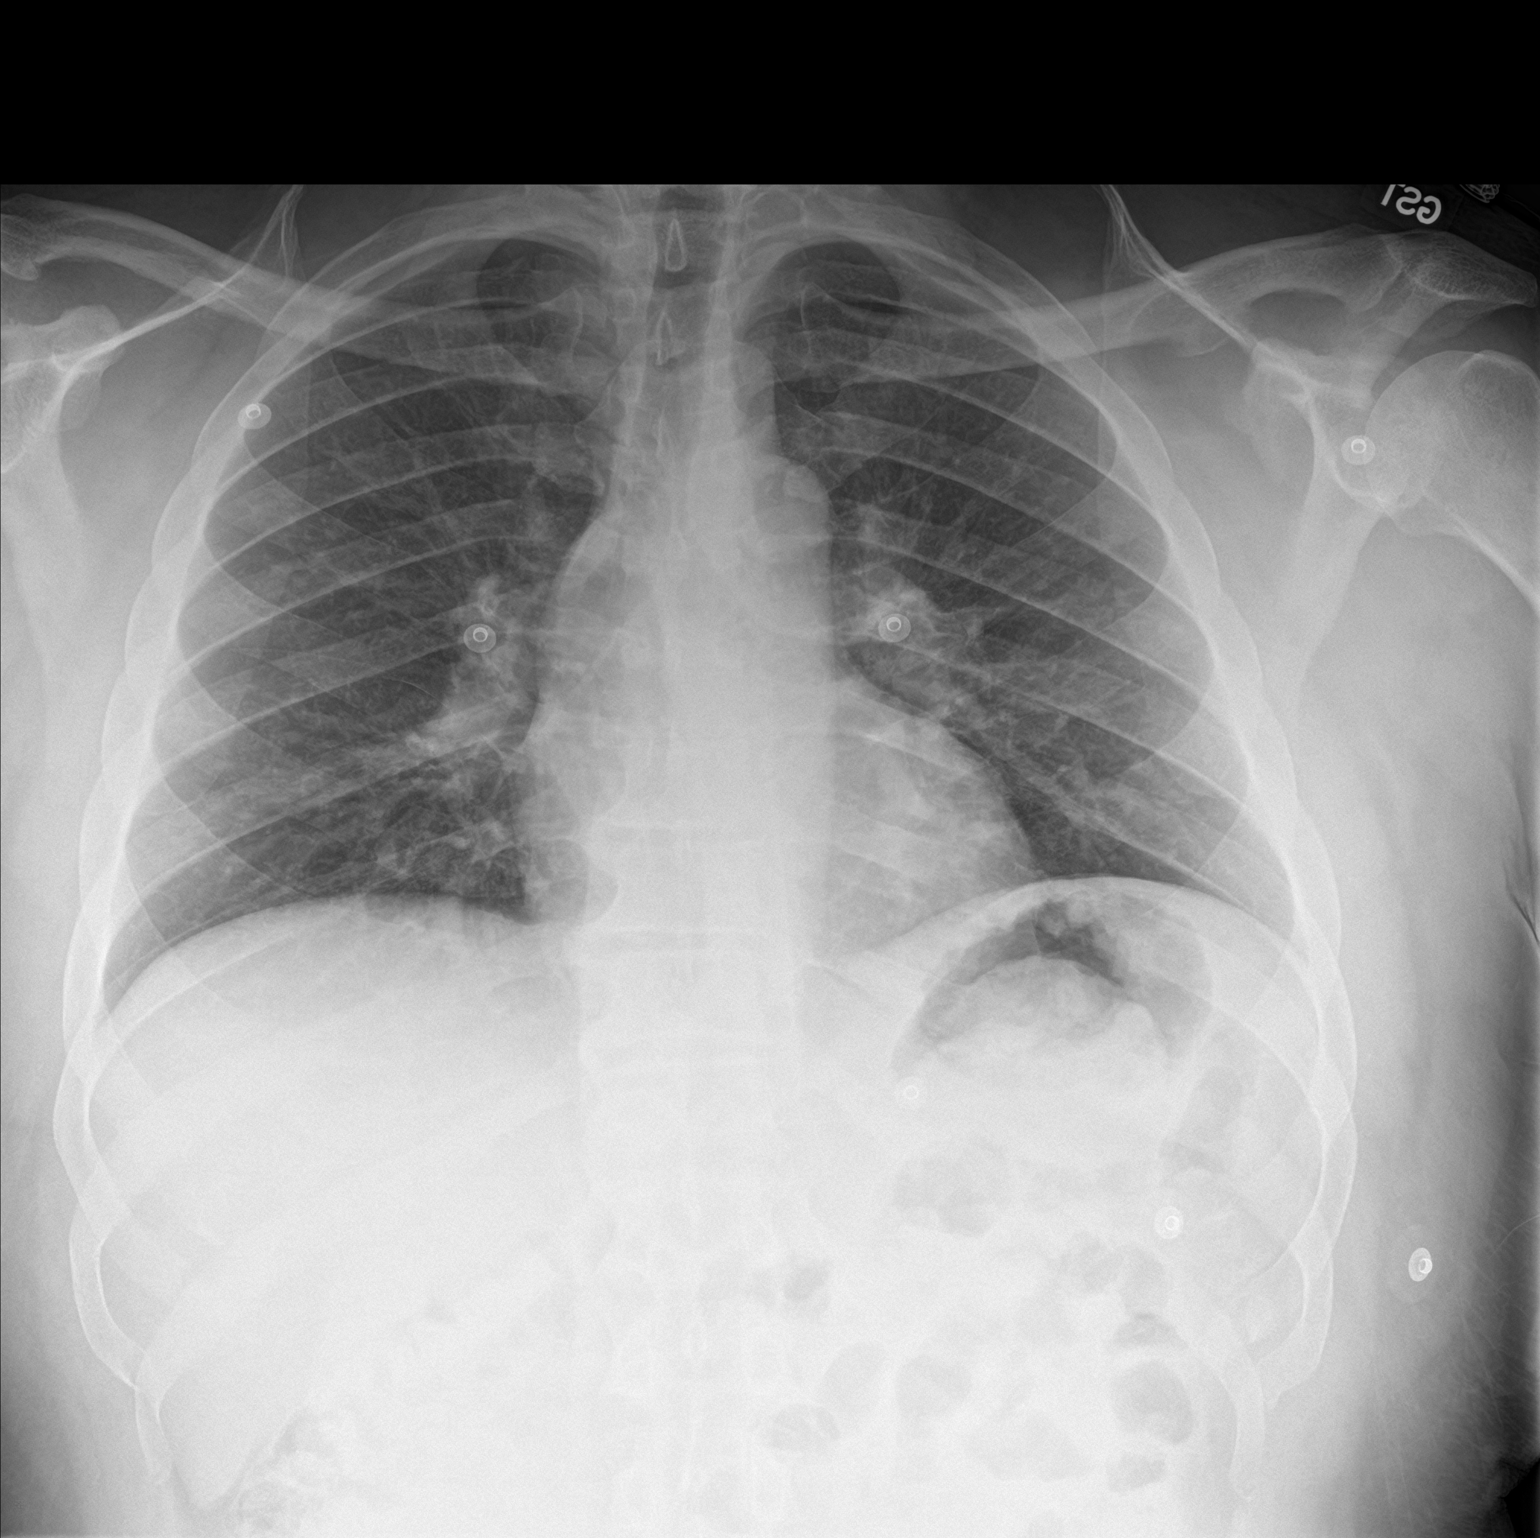

[chest lat]
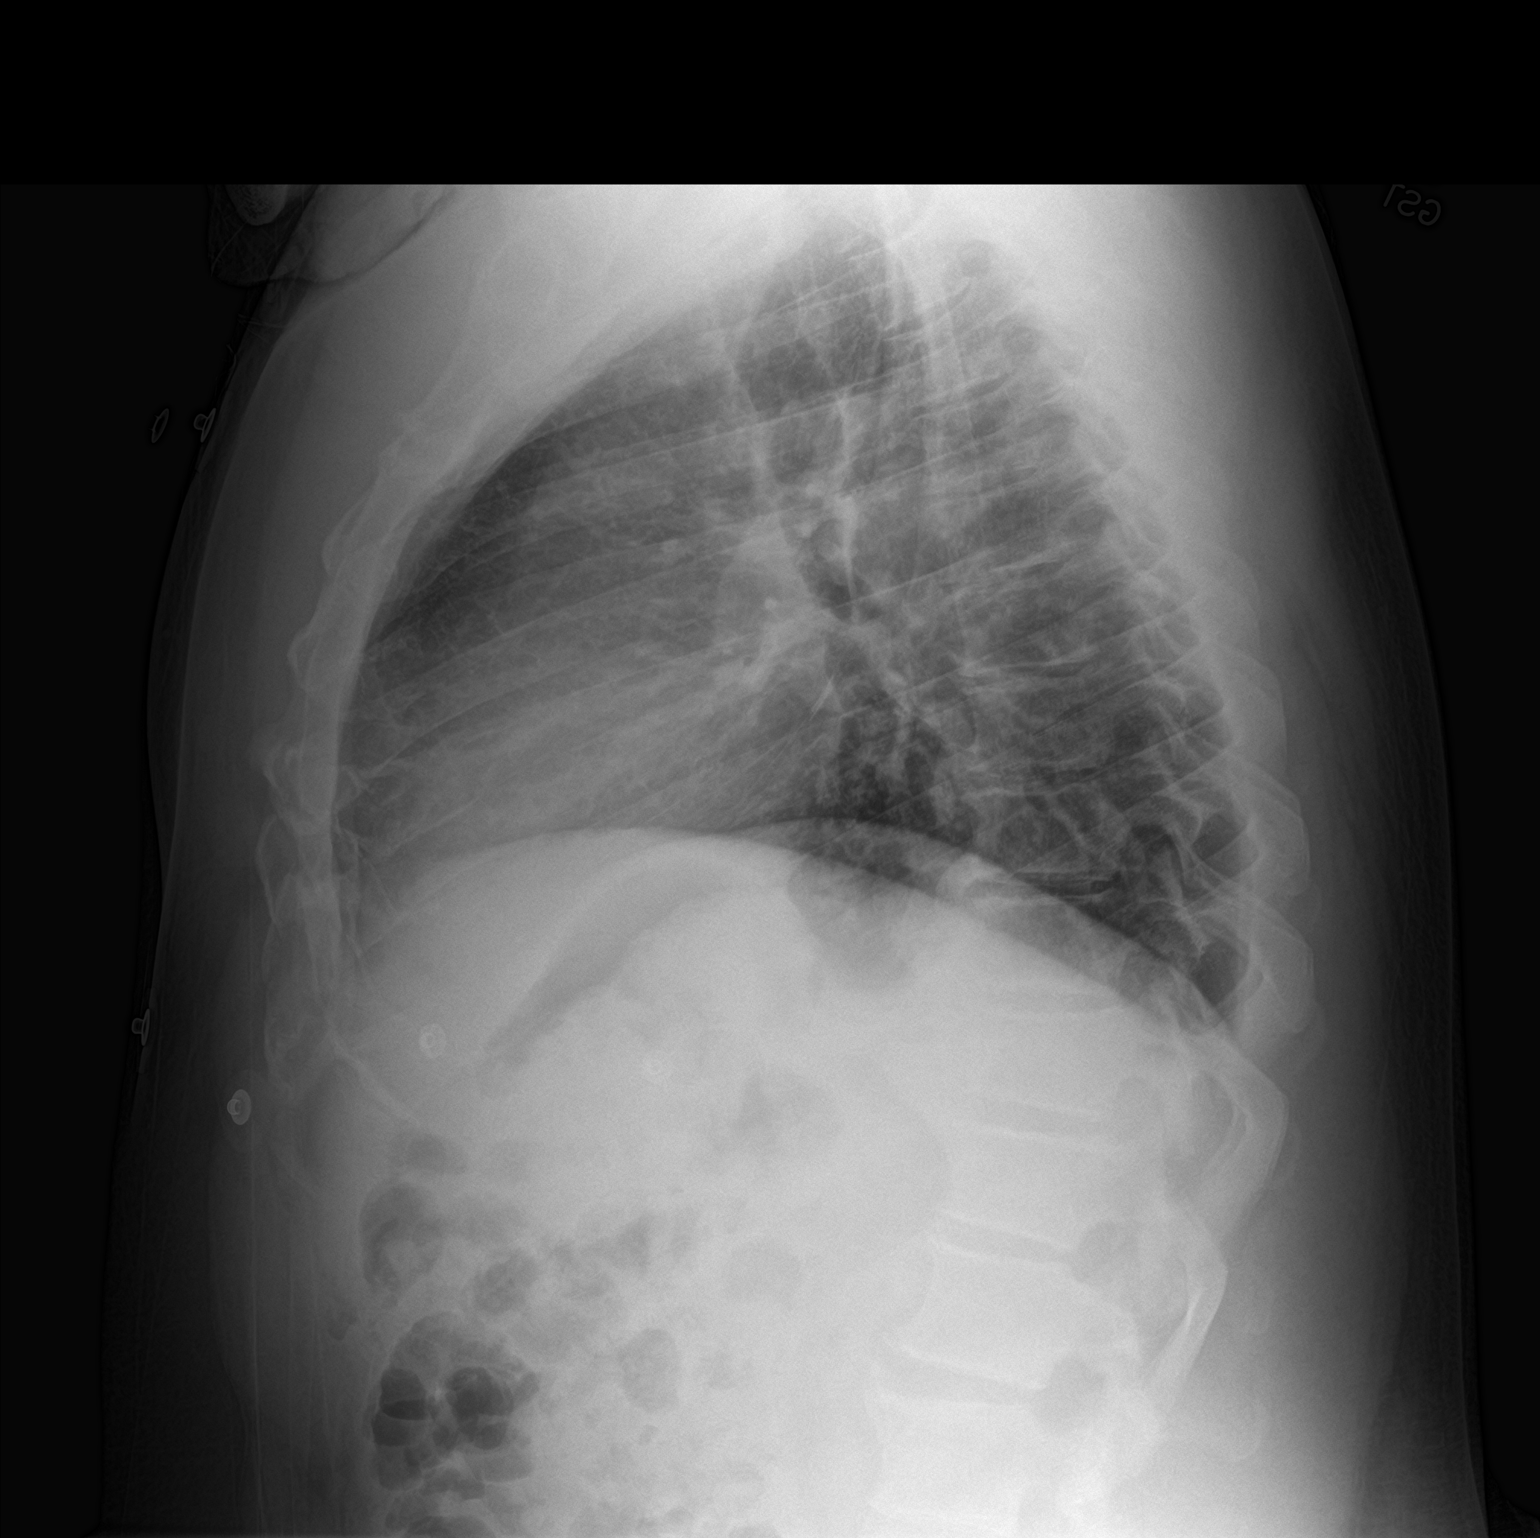

[2 of 2 positions shown; findings below may reference images not displayed]

FINDINGS: The lungs are well-aerated. Minimal bilateral atelectasis is noted.
There is no evidence of pleural effusion or pneumothorax.

The heart is normal in size; the mediastinal contour is within
normal limits. No acute osseous abnormalities are seen.
IMPRESSION: Minimal bilateral atelectasis noted.  Lungs otherwise clear.

## 2016-11-16 IMAGING — CT CT HEAD W/O CM
2 series · 15 of 30 positions shown, 17 images · non-contrast
Comparison: 05/27/2015

CLINICAL DATA: Syncope.

EXAM:
CT HEAD WITHOUT CONTRAST
TECHNIQUE: Contiguous axial images were obtained from the base of the skull
through the vertex without intravenous contrast.

[Series 2: head bone · axial · 0.45mm/px · z∈[-105,+11]mm · 8 of 74 slices shown]
[im 8/74  bone]
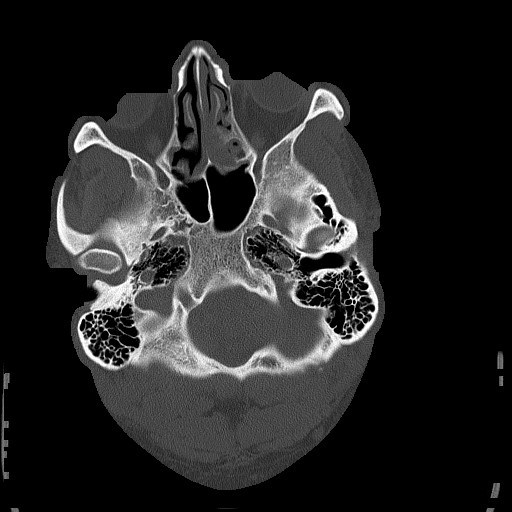
[im 15/74  bone]
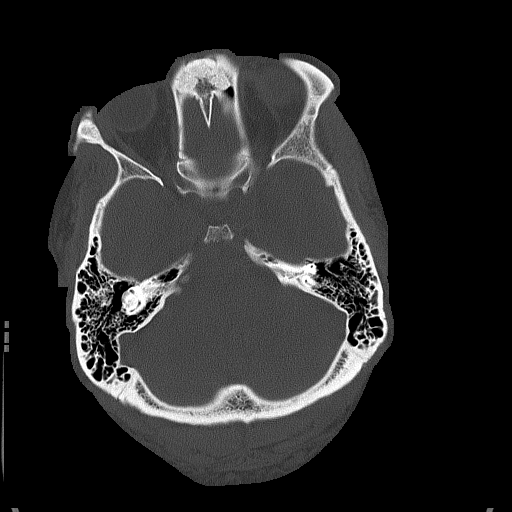
[im 22/74  bone]
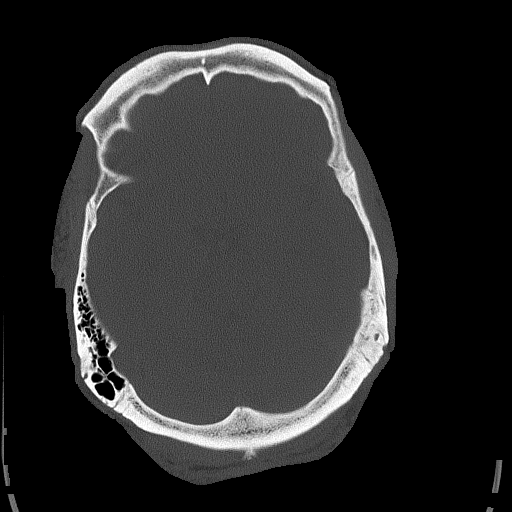
[im 33/74  bone]
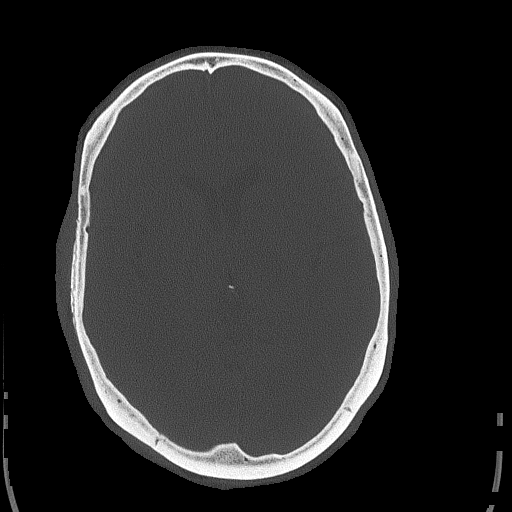
[im 41/74  bone]
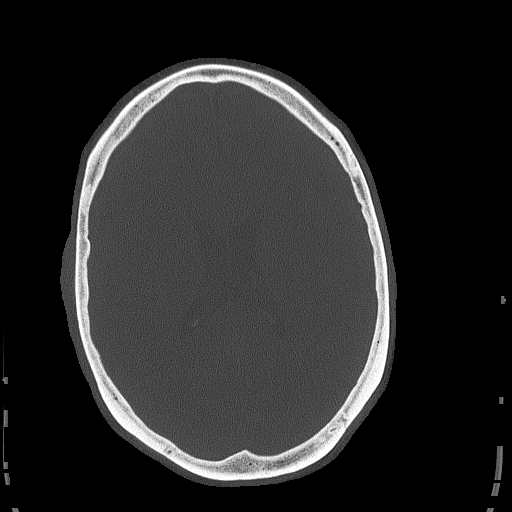
[im 52/74  bone]
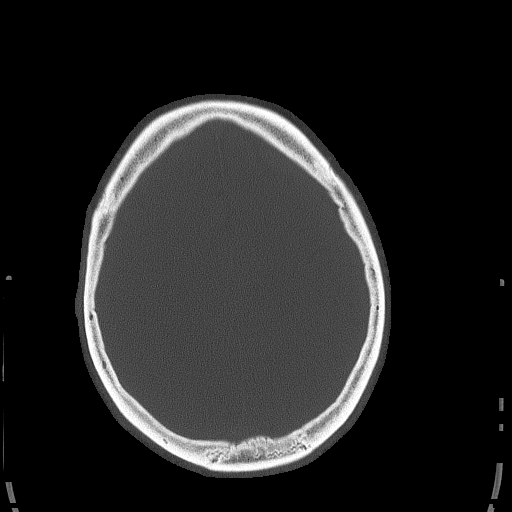
[im 59/74  bone]
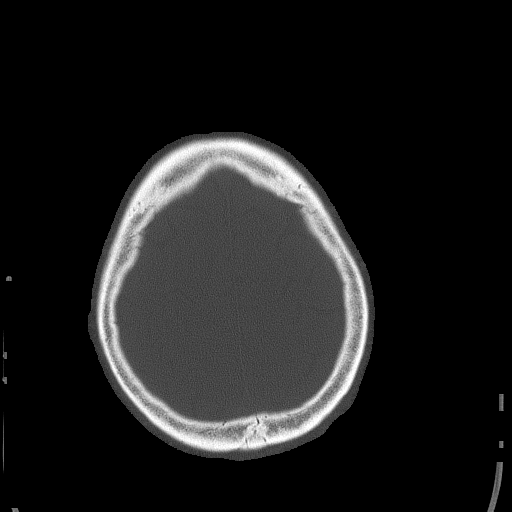
[im 66/74  bone]
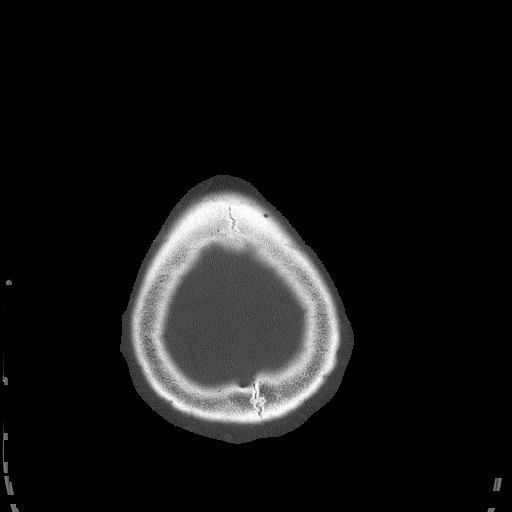

[Series 3: head without · axial · non-contrast · 0.45mm/px · z∈[-104,+6]mm · 7 of 30 slices shown, 9 images]
[im 4/30  brain]
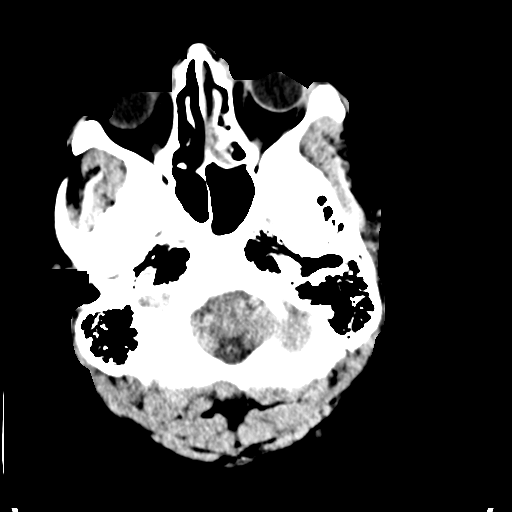
[im 4/30  bone]
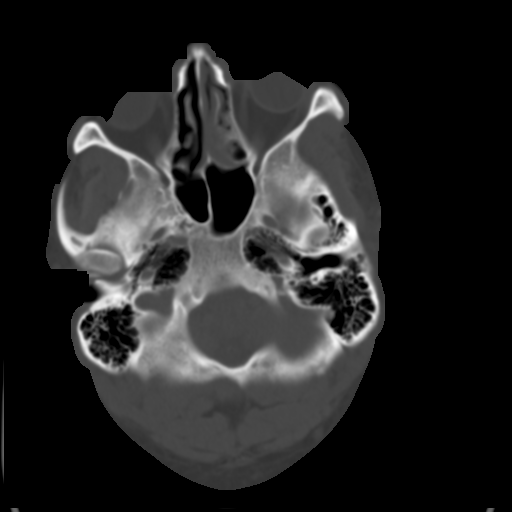
[im 8/30  brain]
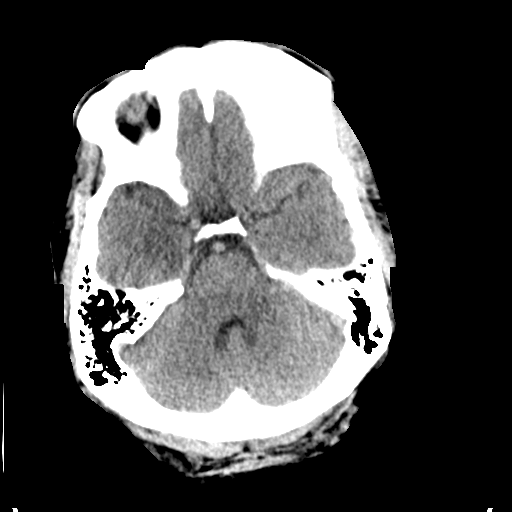
[im 11/30  brain]
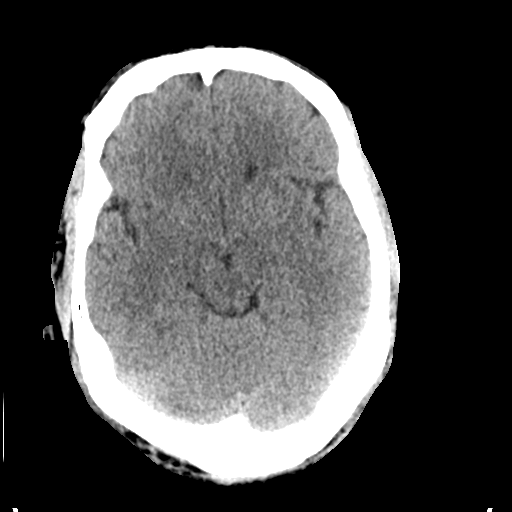
[im 15/30  brain]
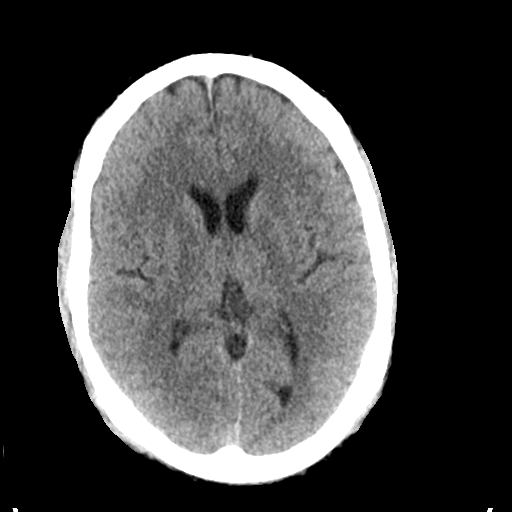
[im 19/30  brain]
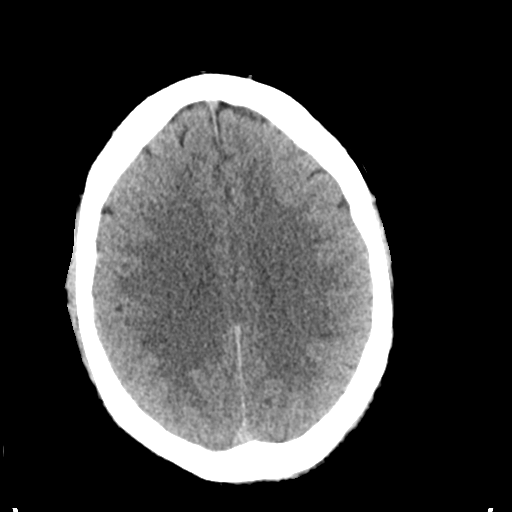
[im 19/30  bone]
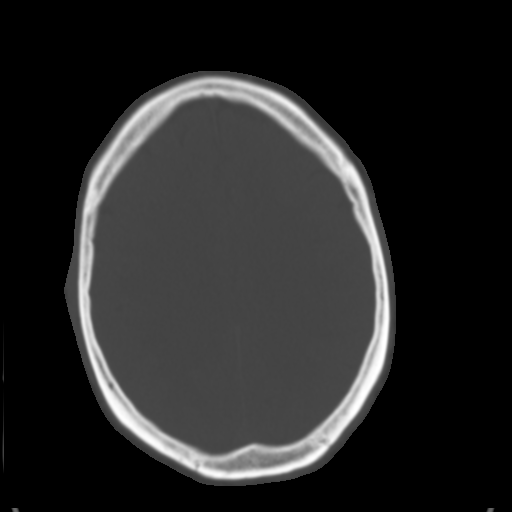
[im 22/30  brain]
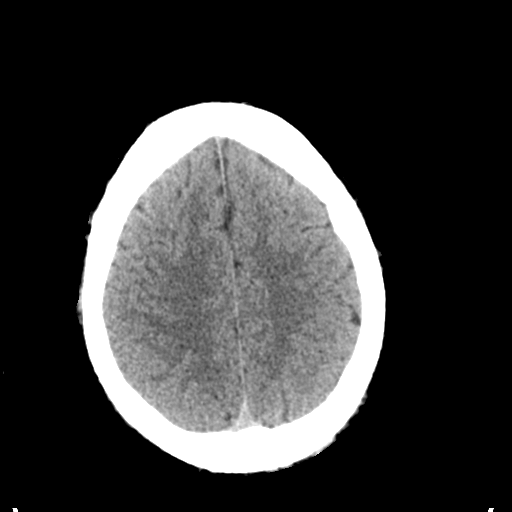
[im 26/30  brain]
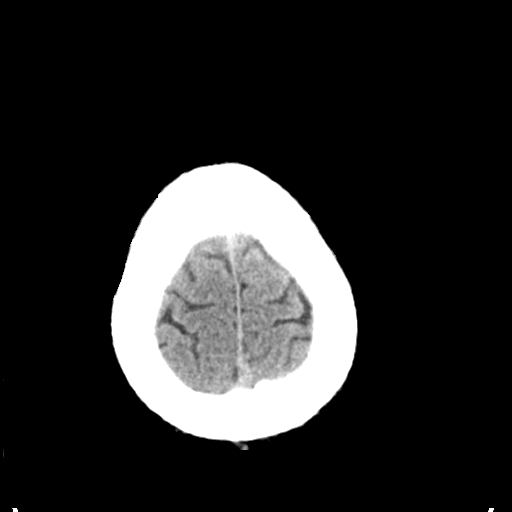

[15 of 30 positions shown; findings below may reference images not displayed]

FINDINGS: There is no intracranial hemorrhage, mass or evidence of acute
infarction. There is no extra-axial fluid collection. Gray matter
and white matter appear normal. Cerebral volume is normal for age.
Brainstem and posterior fossa are unremarkable. The CSF spaces
appear normal.

The bony structures are intact. The visible portions of the
paranasal sinuses are clear.
IMPRESSION: Normal brain.  No acute findings.
